# Patient Record
Sex: Female | Born: 1995 | Marital: Single | Smoking: Never smoker
Health system: Southern US, Community
[De-identification: ages and names within clinical notes are randomized; demographics above are authoritative.]

## PROBLEM LIST (undated history)

## (undated) DIAGNOSIS — K219 Gastro-esophageal reflux disease without esophagitis: Secondary | ICD-10-CM

## (undated) DIAGNOSIS — J189 Pneumonia, unspecified organism: Secondary | ICD-10-CM

## (undated) DIAGNOSIS — J45909 Unspecified asthma, uncomplicated: Secondary | ICD-10-CM

## (undated) HISTORY — DX: Unspecified asthma, uncomplicated: J45.909

## (undated) HISTORY — DX: Pneumonia, unspecified organism: J18.9

## (undated) HISTORY — DX: Gastro-esophageal reflux disease without esophagitis: K21.9

## (undated) HISTORY — PX: MYRINGOTOMY W/ TUBES: SHX2060

---
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
## 2015-02-10 ENCOUNTER — Other Ambulatory Visit: Payer: Self-pay | Admitting: Sports Medicine

## 2015-02-10 DIAGNOSIS — S161XXD Strain of muscle, fascia and tendon at neck level, subsequent encounter: Secondary | ICD-10-CM

## 2015-02-23 ENCOUNTER — Ambulatory Visit: Payer: BLUE CROSS/BLUE SHIELD

## 2015-02-25 ENCOUNTER — Ambulatory Visit: Payer: BLUE CROSS/BLUE SHIELD

## 2015-05-16 ENCOUNTER — Emergency Department: Payer: BLUE CROSS/BLUE SHIELD

## 2015-05-16 ENCOUNTER — Encounter: Payer: Self-pay | Admitting: Emergency Medicine

## 2015-05-16 ENCOUNTER — Emergency Department
Admission: EM | Admit: 2015-05-16 | Discharge: 2015-05-16 | Disposition: A | Discharge status: Home / Self Care | Payer: BLUE CROSS/BLUE SHIELD | Attending: Emergency Medicine | Admitting: Emergency Medicine

## 2015-05-16 DIAGNOSIS — J45909 Unspecified asthma, uncomplicated: Secondary | ICD-10-CM | POA: Diagnosis not present

## 2015-05-16 DIAGNOSIS — R079 Chest pain, unspecified: Secondary | ICD-10-CM | POA: Diagnosis present

## 2015-05-16 DIAGNOSIS — J159 Unspecified bacterial pneumonia: Secondary | ICD-10-CM | POA: Diagnosis not present

## 2015-05-16 DIAGNOSIS — J189 Pneumonia, unspecified organism: Secondary | ICD-10-CM

## 2015-05-16 HISTORY — DX: Unspecified asthma, uncomplicated: J45.909

## 2015-05-16 LAB — CBC
HCT: 35.5 % (ref 35.0–47.0)
HEMOGLOBIN: 11.7 g/dL — AB (ref 12.0–16.0)
MCH: 27.7 pg (ref 26.0–34.0)
MCHC: 32.8 g/dL (ref 32.0–36.0)
MCV: 84.4 fL (ref 80.0–100.0)
Platelets: 224 10*3/uL (ref 150–440)
RBC: 4.21 MIL/uL (ref 3.80–5.20)
RDW: 13 % (ref 11.5–14.5)
WBC: 16.8 10*3/uL — ABNORMAL HIGH (ref 3.6–11.0)

## 2015-05-16 LAB — BASIC METABOLIC PANEL
ANION GAP: 9 (ref 5–15)
BUN: 10 mg/dL (ref 6–20)
CO2: 26 mmol/L (ref 22–32)
Calcium: 9.1 mg/dL (ref 8.9–10.3)
Chloride: 102 mmol/L (ref 101–111)
Creatinine, Ser: 0.64 mg/dL (ref 0.44–1.00)
GFR calc Af Amer: >60 mL/min (ref >60)
GLUCOSE: 128 mg/dL — AB (ref 65–99)
POTASSIUM: 3.4 mmol/L — AB (ref 3.5–5.1)
Sodium: 137 mmol/L (ref 135–145)

## 2015-05-16 LAB — TROPONIN I: Troponin I: <0.03 ng/mL (ref <0.031)

## 2015-05-16 MED ORDER — LEVOFLOXACIN 750 MG PO TABS
750.0000 mg | ORAL_TABLET | Freq: Once | ORAL | Status: AC
  Administered 2015-05-16: 750 mg via ORAL
  Filled 2015-05-16: qty 1

## 2015-05-16 MED ORDER — KETOROLAC TROMETHAMINE 30 MG/ML IJ SOLN
30.0000 mg | Freq: Once | INTRAMUSCULAR | Status: AC
  Administered 2015-05-16: 30 mg via INTRAVENOUS
  Filled 2015-05-16: qty 1

## 2015-05-16 MED ORDER — SODIUM CHLORIDE 0.9 % IV SOLN
Freq: Once | INTRAVENOUS | Status: AC
  Administered 2015-05-16: 21:00:00 via INTRAVENOUS

## 2015-05-16 MED ORDER — LEVOFLOXACIN 750 MG PO TABS: 750.0000 mg | ORAL_TABLET | Freq: Every day | ORAL | Status: DC

## 2015-05-16 MED ORDER — ACETAMINOPHEN 500 MG PO TABS
1000.0000 mg | ORAL_TABLET | Freq: Once | ORAL | Status: AC
  Administered 2015-05-16: 1000 mg via ORAL
  Filled 2015-05-16: qty 2

## 2015-05-16 MED ORDER — FENTANYL CITRATE (PF) 100 MCG/2ML IJ SOLN
50.0000 ug | Freq: Once | INTRAMUSCULAR | Status: AC
  Administered 2015-05-16: 50 ug via INTRAVENOUS
  Filled 2015-05-16: qty 2

## 2015-05-16 NOTE — ED Notes (Signed)
MD at bedside. 

## 2015-05-16 NOTE — ED Notes (Addendum)
Pt via EMS for SOB, left sided cp radiating to back, and fever. Pt comes from [place]. Pt febrile and sats 88-93%, RN put pt on 2L at this time. Pt was sick on break at home with cold and states it has worsen. Pt roommate is also sick with cold. Pt A&O

## 2015-05-16 NOTE — ED Notes (Signed)
Pt. was ambulated to monitor O2 stat. O2 stat remained above 90% for entire durration of ambulation

## 2015-05-16 NOTE — ED Provider Notes (Signed)
[facility] Emergency Department Provider Note  ____________________________________________  Time seen: Approximately 9:16 PM  I have reviewed the triage vital signs and the nursing notes.   HISTORY  Chief Complaint Chest Pain and Fever    HPI Kari Walker is a 20 y.o. female with a history of asthma presenting with left-sided chest pain. Patient reports that over the last 2 weeks she developed a cough productive of yellow thick sputum. She has felt worse over the last week with exertional shortness of breath and subjective fever. No nausea or vomiting. Today she awoke from her nap with the onset of an acute sharp pain in the left lower lateral chest wall which radiated to her back, and has improved without any intervention at this time. She denies any lightheadedness or syncope. She does take OCPs. No personal or family history of blood clots.   Past Medical History  Diagnosis Date  . Asthma     There are no active problems to display for this patient.   No past surgical history on file.  No current outpatient prescriptions on file.  Allergies Review of patient's allergies indicates no known allergies.  No family history on file.  Social History Social History  Substance Use Topics  . Smoking status: Never Smoker   . Smokeless tobacco: None  . Alcohol Use: Yes     Comment: occasional     Review of Systems Constitutional: Positive subjective fever. No chills. Eyes: No visual changes. ENT: No sore throat. Positive congestion. Cardiovascular: Positive left lateral chest pain, no palpitations. Respiratory: As of exertional shortness of breath.  As a productive cough. Gastrointestinal: No abdominal pain.  No nausea, no vomiting.  No diarrhea.  No constipation. Genitourinary: Negative for dysuria. Musculoskeletal: Negative for back pain. Skin: Negative for rash. Neurological: Negative for headaches, focal weakness or numbness.  10-point  ROS otherwise negative.  ____________________________________________   PHYSICAL EXAM:  VITAL SIGNS: ED Triage Vitals  Enc Vitals Group     BP 05/16/15 2047 116/53 mmHg     Pulse Rate 05/16/15 2047 112     Resp 05/16/15 2047 20     Temp 05/16/15 2047 103.2 F (39.6 C)     Temp Source 05/16/15 2047 Oral     SpO2 05/16/15 2047 97 %     Weight 05/16/15 2047 150 lb (68.04 kg)     Height 05/16/15 2047 5\' 9"  (1.753 m)     Head Cir --      Peak Flow --      Pain Score 05/16/15 2042 7     Pain Loc --      Pain Edu? --      Excl. in GC? --     Constitutional: Alert and oriented. Well appearing and in no acute distress. Answer question appropriately. Eyes: Conjunctivae are normal.  EOMI. no discharge from the eyes. Head: Atraumatic. Nose: Positive congestion without active rhinorrhea.. Mouth/Throat: Mucous membranes are moist.  Neck: No stridor.  Supple.  No JVD. He gives midline. Cardiovascular: Fast rate, regular rhythm. No murmurs, rubs or gallops.  Respiratory: Clear lungs to auscultation bilaterally. Deep breaths do induce worsening pain. No accessory muscle use or retractions but the patient has a very minimal tachypnea. She is able to speak in full sentences. I am unable to reproduce her left lateral chest wall pain with palpation; no crepitus. Gastrointestinal: Soft and nontender. No distention. No peritoneal signs. Musculoskeletal: No LE edema. No palpable cords, tenderness to Palpation in the calves,  or Homans sign. Neurologic:  Normal speech and language. No gross focal neurologic deficits are appreciated.  Skin:  Skin is warm, dry and intact. No rash noted. Psychiatric: Mood and affect are normal. Speech and behavior are normal.  Normal judgement.  ____________________________________________   LABS (all labs ordered are listed, but only abnormal results are displayed)  Labs Reviewed  BASIC METABOLIC PANEL - Abnormal; Notable for the following:    Potassium 3.4 (*)     Glucose, Bld 128 (*)    All other components within normal limits  CBC - Abnormal; Notable for the following:    WBC 16.8 (*)    Hemoglobin 11.7 (*)    All other components within normal limits  TROPONIN I   ____________________________________________  EKG ED ECG REPORT I, Rockne Menghini, the attending physician, personally viewed and interpreted this ECG.   Date: 05/16/2015  EKG Time: 2044  Rate: 107  Rhythm: sinus tachycardia  Axis: Normal  Intervals:none  ST&T Change: Nonspecific T-wave inversions in V1. No ST elevation. No ischemic changes.  ____________________________________________  RADIOLOGY  Dg Chest 2 View  05/16/2015  CLINICAL DATA:  Complains of cough and left lower rib pain. EXAM: CHEST  2 VIEW COMPARISON:  None. FINDINGS: Cardiomediastinal silhouette is normal. Mediastinal contours appear intact. There is no evidence of pleural effusion or pneumothorax. Subtle haziness of the left lower lobe seen on the frontal view may be artifactual or may represent a developing focal airspace consolidation. Osseous structures are without acute abnormality. Soft tissues are grossly normal. IMPRESSION: Subtle haziness of the left lower lobe, which may be artifactual or may represent a developing airspace consolidation. Electronically Signed   By: Ted Mcalpine M.D.   On: 05/16/2015 21:55    ____________________________________________   PROCEDURES  Procedure(s) performed: None  Critical Care performed: No ____________________________________________   INITIAL IMPRESSION / ASSESSMENT AND PLAN / ED COURSE  Pertinent labs & imaging results that were available during my care of the patient were reviewed by me and considered in my medical decision making (see chart for details).  20 y.o. female with a history of asthma presenting with 2 weeks of productive cough and today acute onset of left lateral chest wall pain. Here, the patient has a fever of 103.2 and  associated tachycardia without tachypnea. I removed her from exogenous oxygen and her sats remained 95% and greater; it is possible that she had a poor tracing with the triage numbers for that she has improved at this time. I will evaluate her for pneumonia consider spontaneous pneumothorax that she is tall white female. PE is less likely although on the differential if the rest of her evaluation remains negative.  ----------------------------------------- 10:01 PM on 05/16/2015 -----------------------------------------  The patient's chest x-ray does show some consolidation in the left lower lobe which is consistent with the pain that she has been having and the clinical symptoms as well. I will do an ambulatory pulse ox to confirm that she does not desaturate when she ambulates, and if she remains within normal limits I will plan discharge home. She will follow-up with her primary care physician or with the [facility] on Monday for reevaluation.  ____________________________________________  FINAL CLINICAL IMPRESSION(S) / ED DIAGNOSES  Final diagnoses:  Community acquired pneumonia      NEW MEDICATIONS STARTED DURING THIS VISIT:  New Prescriptions   No medications on file     Rockne Menghini, MD 05/16/15 2202

## 2015-05-16 NOTE — Discharge Instructions (Signed)
Please drink plenty of fluid and stay well rested. You may use here albuterol inhaler if you develop coughing spasms or wheezing. Please take the entire course of antibiotics even if you're feeling better.  Please return to the emergency department if you develop severe pain, shortness of breath, fever, inability to keep down fluids, or any other symptoms concerning to you.

## 2015-05-28 ENCOUNTER — Emergency Department: Payer: BLUE CROSS/BLUE SHIELD

## 2015-05-28 ENCOUNTER — Inpatient Hospital Stay
Admission: EM | Admit: 2015-05-28 | Discharge: 2015-05-31 | DRG: 871 | Disposition: A | Discharge status: Home / Self Care | Payer: BLUE CROSS/BLUE SHIELD | Attending: Internal Medicine | Admitting: Internal Medicine

## 2015-05-28 DIAGNOSIS — A419 Sepsis, unspecified organism: Secondary | ICD-10-CM | POA: Diagnosis present

## 2015-05-28 DIAGNOSIS — J90 Pleural effusion, not elsewhere classified: Secondary | ICD-10-CM | POA: Diagnosis present

## 2015-05-28 DIAGNOSIS — J45909 Unspecified asthma, uncomplicated: Secondary | ICD-10-CM | POA: Diagnosis present

## 2015-05-28 DIAGNOSIS — J189 Pneumonia, unspecified organism: Secondary | ICD-10-CM | POA: Diagnosis present

## 2015-05-28 DIAGNOSIS — R0602 Shortness of breath: Secondary | ICD-10-CM

## 2015-05-28 LAB — BASIC METABOLIC PANEL
Anion gap: 9 (ref 5–15)
BUN: 10 mg/dL (ref 6–20)
CALCIUM: 9.5 mg/dL (ref 8.9–10.3)
CO2: 27 mmol/L (ref 22–32)
CREATININE: 0.77 mg/dL (ref 0.44–1.00)
Chloride: 96 mmol/L — ABNORMAL LOW (ref 101–111)
GFR calc non Af Amer: >60 mL/min (ref >60)
Glucose, Bld: 137 mg/dL — ABNORMAL HIGH (ref 65–99)
Potassium: 3.8 mmol/L (ref 3.5–5.1)
SODIUM: 132 mmol/L — AB (ref 135–145)

## 2015-05-28 LAB — CBC
HCT: 40.9 % (ref 35.0–47.0)
Hemoglobin: 13.1 g/dL (ref 12.0–16.0)
MCH: 27.5 pg (ref 26.0–34.0)
MCHC: 32.2 g/dL (ref 32.0–36.0)
MCV: 85.4 fL (ref 80.0–100.0)
PLATELETS: 321 10*3/uL (ref 150–440)
RBC: 4.79 MIL/uL (ref 3.80–5.20)
RDW: 12.7 % (ref 11.5–14.5)
WBC: 26.9 10*3/uL — AB (ref 3.6–11.0)

## 2015-05-28 LAB — LACTIC ACID, PLASMA: Lactic Acid, Venous: 1.4 mmol/L (ref 0.5–2.0)

## 2015-05-28 LAB — RAPID INFLUENZA A&B ANTIGENS (ARMC ONLY): INFLUENZA B (ARMC): NEGATIVE

## 2015-05-28 LAB — MONONUCLEOSIS SCREEN: MONO SCREEN: NEGATIVE

## 2015-05-28 LAB — POCT RAPID STREP A: Streptococcus, Group A Screen (Direct): NEGATIVE

## 2015-05-28 LAB — RAPID INFLUENZA A&B ANTIGENS: Influenza A (ARMC): NEGATIVE

## 2015-05-28 MED ORDER — DEXTROSE 5 % IV SOLN
1.0000 g | INTRAVENOUS | Status: AC
  Administered 2015-05-29 – 2015-05-31 (×3): 1 g via INTRAVENOUS
  Filled 2015-05-28 (×3): qty 10

## 2015-05-28 MED ORDER — ENOXAPARIN SODIUM 40 MG/0.4ML ~~LOC~~ SOLN
40.0000 mg | SUBCUTANEOUS | Status: DC
  Administered 2015-05-28 – 2015-05-29 (×2): 40 mg via SUBCUTANEOUS
  Filled 2015-05-28 (×2): qty 0.4

## 2015-05-28 MED ORDER — ACETAMINOPHEN 325 MG PO TABS
650.0000 mg | ORAL_TABLET | Freq: Four times a day (QID) | ORAL | Status: DC | PRN
  Administered 2015-05-28: 19:00:00 650 mg via ORAL
  Filled 2015-05-28: qty 2

## 2015-05-28 MED ORDER — POLYETHYLENE GLYCOL 3350 17 G PO PACK: 17.0000 g | PACK | Freq: Every day | ORAL | Status: DC | PRN

## 2015-05-28 MED ORDER — AZITHROMYCIN 250 MG PO TABS
500.0000 mg | ORAL_TABLET | Freq: Every day | ORAL | Status: DC
  Administered 2015-05-29: 09:00:00 500 mg via ORAL
  Filled 2015-05-28: qty 2

## 2015-05-28 MED ORDER — SODIUM CHLORIDE 0.9 % IV BOLUS (SEPSIS)
1000.0000 mL | Freq: Once | INTRAVENOUS | Status: AC
  Administered 2015-05-28: 1000 mL via INTRAVENOUS

## 2015-05-28 MED ORDER — CEFTRIAXONE SODIUM 1 G IJ SOLR
1.0000 g | INTRAMUSCULAR | Status: DC
  Filled 2015-05-28: qty 10

## 2015-05-28 MED ORDER — ONDANSETRON HCL 4 MG/2ML IJ SOLN: 4.0000 mg | Freq: Four times a day (QID) | INTRAMUSCULAR | Status: DC | PRN

## 2015-05-28 MED ORDER — ONDANSETRON HCL 4 MG PO TABS: 4.0000 mg | ORAL_TABLET | Freq: Four times a day (QID) | ORAL | Status: DC | PRN

## 2015-05-28 MED ORDER — SODIUM CHLORIDE 0.9 % IJ SOLN
3.0000 mL | Freq: Two times a day (BID) | INTRAMUSCULAR | Status: DC
  Administered 2015-05-28 – 2015-05-31 (×6): 3 mL via INTRAVENOUS

## 2015-05-28 MED ORDER — ALBUTEROL SULFATE (2.5 MG/3ML) 0.083% IN NEBU: 2.5000 mg | INHALATION_SOLUTION | RESPIRATORY_TRACT | Status: DC | PRN

## 2015-05-28 MED ORDER — SODIUM CHLORIDE 0.9 % IV BOLUS (SEPSIS)
1500.0000 mL | Freq: Once | INTRAVENOUS | Status: AC
Start: 2015-05-28 — End: 2015-05-28
  Administered 2015-05-28: 1000 mL via INTRAVENOUS

## 2015-05-28 MED ORDER — DEXTROSE 5 % IV SOLN
1.0000 g | Freq: Once | INTRAVENOUS | Status: AC
  Administered 2015-05-28: 1 g via INTRAVENOUS
  Filled 2015-05-28: qty 10

## 2015-05-28 MED ORDER — ACETAMINOPHEN 325 MG PO TABS
650.0000 mg | ORAL_TABLET | Freq: Once | ORAL | Status: AC | PRN
  Administered 2015-05-28: 650 mg via ORAL
  Filled 2015-05-28: qty 2

## 2015-05-28 MED ORDER — DEXTROSE 5 % IV SOLN
500.0000 mg | Freq: Once | INTRAVENOUS | Status: AC
  Administered 2015-05-28: 500 mg via INTRAVENOUS
  Filled 2015-05-28: qty 500

## 2015-05-28 MED ORDER — ACETAMINOPHEN 650 MG RE SUPP: 650.0000 mg | Freq: Four times a day (QID) | RECTAL | Status: DC | PRN

## 2015-05-28 MED ORDER — BISACODYL 5 MG PO TBEC: 5.0000 mg | DELAYED_RELEASE_TABLET | Freq: Every day | ORAL | Status: DC | PRN

## 2015-05-28 NOTE — ED Provider Notes (Signed)
[facility] Emergency Department Provider Note  ____________________________________________  Time seen: Approximately 1:41 PM  I have reviewed the triage vital signs and the nursing notes.   HISTORY  Chief Complaint Fever and URI    HPI Kari Walker is a 20 y.o. female who presents emergency department complaining of increased pneumonia symptoms. Patient was seen here in the emergency department 2 weeks ago, diagnosed with left lower lobe pneumonia and discharged home with antibiotics. Patient states that she had some initial improvement on the oral Levaquin but states that over the past several days her symptoms have returned and worsened. Patient took antibiotics as she was supposed to. She states that she had a sore throat 2-3 days ago and was [facility] And had a negative strep test. She states yesterday she started with a fever, body aches, and increasing "lung pain." She endorses a productive cough, low-grade fever, left lower lung pain. He denies any headache, neck pain, shortness of breath, difficulty breathing, abdominal pain, nausea or vomiting, diarrhea or constipation.   Past Medical History  Diagnosis Date  . Asthma     Patient Active Problem List   Diagnosis Date Noted  . Pneumonia 05/28/2015    History reviewed. No pertinent past surgical history.  Current Outpatient Rx  Name  Route  Sig  Dispense  Refill  . mometasone-formoterol (DULERA) 100-5 MCG/ACT AERO   Inhalation   Inhale 2 puffs into the lungs 2 (two) times daily.         Marland Kitchen albuterol (PROVENTIL HFA;VENTOLIN HFA) 108 (90 Base) MCG/ACT inhaler   Inhalation   Inhale 2 puffs into the lungs every 6 (six) hours as needed for wheezing or shortness of breath.   1 Inhaler   2   . doxycycline (VIBRA-TABS) 100 MG tablet   Oral   Take 1 tablet (100 mg total) by mouth every 12 (twelve) hours.   14 tablet   0   . guaiFENesin (MUCINEX) 600 MG 12 hr tablet    Oral   Take 1 tablet (600 mg total) by mouth 2 (two) times daily as needed for cough.   15 tablet   0     Allergies Review of patient's allergies indicates no known allergies.  History reviewed. No pertinent family history.  Social History Social History  Substance Use Topics  . Smoking status: Never Smoker   . Smokeless tobacco: None  . Alcohol Use: Yes     Comment: occasional      Review of Systems  Constitutional: Positive fever/chills Eyes: No visual changes. No discharge ENT: Positive sore throat 2-3 days prior. Cardiovascular: no chest pain. Respiratory: Positive for cough. No SOB. Gastrointestinal: No abdominal pain.  No nausea, no vomiting.  No diarrhea.  No constipation. Genitourinary: Negative for dysuria. No hematuria Musculoskeletal: Negative for back pain. Skin: Negative for rash. Neurological: Negative for headaches, focal weakness or numbness. 10-point ROS otherwise negative.  ____________________________________________   PHYSICAL EXAM:  VITAL SIGNS: ED Triage Vitals  Enc Vitals Group     BP 05/28/15 1118 105/64 mmHg     Pulse Rate 05/28/15 1118 117     Resp 05/28/15 1118 18     Temp 05/28/15 1118 100.6 F (38.1 C)     Temp Source 05/28/15 1118 Oral     SpO2 05/28/15 1118 94 %     Weight 05/28/15 1118 150 lb (68.04 kg)     Height 05/28/15 1118  (1.753 m)  Head Cir --      Peak Flow --      Pain Score 05/28/15 1118 4     Pain Loc --      Pain Edu? --      Excl. in GC? --      Constitutional: Alert and oriented. Well appearing and in no acute distress. Eyes: Conjunctivae are normal. PERRL. EOMI. Head: Atraumatic. ENT:      Ears:       Nose: No congestion/rhinnorhea.      Mouth/Throat: Mucous membranes are moist.  Neck: No stridor.   Hematological/Lymphatic/Immunilogical: No cervical lymphadenopathy. Cardiovascular: Normal rate, regular rhythm. Normal S1 and S2.  Good peripheral circulation. Respiratory: Normal respiratory  effort without tachypnea or retractions. Lungs with rales and rhonchi to the left lower lung field. No decreased or absent breath sounds. No wheezing. No use of the sensory muscles on breathing. Gastrointestinal: Soft and nontender. No distention. No CVA tenderness. Musculoskeletal: No lower extremity tenderness nor edema.  No joint effusions. Neurologic:  Normal speech and language. No gross focal neurologic deficits are appreciated.  Skin:  Skin is warm, dry and intact. No rash noted. Psychiatric: Mood and affect are normal. Speech and behavior are normal. Patient exhibits appropriate insight and judgement.   ____________________________________________   LABS (all labs ordered are listed, but only abnormal results are displayed)  Labs Reviewed  BASIC METABOLIC PANEL - Abnormal; Notable for the following:    Sodium 132 (*)    Chloride 96 (*)    Glucose, Bld 137 (*)    All other components within normal limits  CBC - Abnormal; Notable for the following:    WBC 26.9 (*)    All other components within normal limits  BASIC METABOLIC PANEL - Abnormal; Notable for the following:    Sodium 133 (*)    Glucose, Bld 103 (*)    Calcium 8.5 (*)    All other components within normal limits  CBC - Abnormal; Notable for the following:    WBC 20.5 (*)    Hemoglobin 11.0 (*)    HCT 33.8 (*)    All other components within normal limits  CBC - Abnormal; Notable for the following:    Hemoglobin 11.3 (*)    HCT 34.2 (*)    All other components within normal limits  BASIC METABOLIC PANEL - Abnormal; Notable for the following:    Glucose, Bld 102 (*)    All other components within normal limits  RAPID INFLUENZA A&B ANTIGENS ([facility] ONLY)  CULTURE, BLOOD (ROUTINE X 2)  CULTURE, BLOOD (ROUTINE X 2)  CULTURE, EXPECTORATED SPUTUM-ASSESSMENT  CULTURE, RESPIRATORY (NON-EXPECTORATED)  MONONUCLEOSIS SCREEN  LACTIC ACID, PLASMA  STREP PNEUMONIAE URINARY ANTIGEN  PROCALCITONIN  PROCALCITONIN  MISC  [facility] TEST (SEND OUT)  POCT RAPID STREP A   ____________________________________________  EKG   ____________________________________________  RADIOLOGY Festus Barren Sumner Kirchman, personally viewed and evaluated these images (plain radiographs) as part of my medical decision making, as well as reviewing the written report by the radiologist.  No results found.  ____________________________________________    PROCEDURES  Procedure(s) performed:       Medications  acetaminophen (TYLENOL) tablet 650 mg (650 mg Oral Given 05/28/15 1125)  cefTRIAXone (ROCEPHIN) 1 g in dextrose 5 % 50 mL IVPB (0 g Intravenous Stopped 05/28/15 1548)  azithromycin (ZITHROMAX) 500 mg in dextrose 5 % 250 mL IVPB (0 mg Intravenous Stopped 05/28/15 1712)  sodium chloride 0.9 % bolus 1,000 mL (0 mLs Intravenous Stopped 05/28/15  1712)  sodium chloride 0.9 % bolus 1,500 mL (1,000 mLs Intravenous Given 05/28/15 1730)  cefTRIAXone (ROCEPHIN) 1 g in dextrose 5 % 50 mL IVPB (1 g Intravenous Given 05/31/15 0753)  azithromycin (ZITHROMAX) 500 mg in dextrose 5 % 250 mL IVPB (500 mg Intravenous Given 05/31/15 0827)  iohexol (OMNIPAQUE) 300 MG/ML solution 75 mL (75 mLs Intravenous Contrast Given 05/29/15 1415)     ____________________________________________   INITIAL IMPRESSION / ASSESSMENT AND PLAN / ED COURSE  Pertinent labs & imaging results that were available during my care of the patient were reviewed by me and considered in my medical decision making (see chart for details).  Patient's diagnosis is consistent with worsening left lower lobe pneumonia. Patient was discharged home from the emergency department 2 weeks ago with a diagnosis of left lower lobe pneumonia and was given oral Levaquin at home use. Patient states that she had initial improvement and has had worsening over the past several days. Repeat chest x-ray revealed increasing infiltrate in the left lower lobe. Patient's labs returned with  increasing white blood cell count. Patient was given IV Rocephin and azithromycin here in the emergency department. This time diagnosis is consistent with worsening left lower lobe pneumonia. There is no indication that this is cardiac in nature, PE or pneumothorax in origin, or other acute pathology. Dr. Alphonzo Lemmings was informed of patient's previous ED discharge diagnosis, clinical progress, assessment today, laboratory and x-ray results. It was decided that this patient would be a good candidate for admission. Hospitalist informed and advised that they would admit patient.    ____________________________________________  FINAL CLINICAL IMPRESSION(S) / ED DIAGNOSES  Final diagnoses:  Community acquired pneumonia      NEW MEDICATIONS STARTED DURING THIS VISIT:  Discharge Medication List as of 05/31/2015 10:41 AM    START taking these medications   Details  albuterol (PROVENTIL HFA;VENTOLIN HFA) 108 (90 Base) MCG/ACT inhaler Inhale 2 puffs into the lungs every 6 (six) hours as needed for wheezing or shortness of breath., Starting 05/31/2015, Until Discontinued, Normal    doxycycline (VIBRA-TABS) 100 MG tablet Take 1 tablet (100 mg total) by mouth every 12 (twelve) hours., Starting 05/31/2015, Until Discontinued, Normal    guaiFENesin (MUCINEX) 600 MG 12 hr tablet Take 1 tablet (600 mg total) by mouth 2 (two) times daily as needed for cough., Starting 05/31/2015, Until Discontinued, Normal            Racheal Patches, PA-C 06/02/15 1514  Jeanmarie Plant, MD 06/04/15 1352

## 2015-05-28 NOTE — H&P (Addendum)
[facility] - [place] [facility]   PATIENT NAME: Kari Walker    MR#:  [id number]  DATE OF BIRTH:  Sep 19, 1995  DATE OF ADMISSION:  05/28/2015  PRIMARY CARE PHYSICIAN: Pcp Not In System   REQUESTING/REFERRING PHYSICIAN: Dr. Silverio Lay  CHIEF COMPLAINT:   Chief Complaint  Patient presents with  . Fever  . URI    HISTORY OF PRESENT ILLNESS:  Kari Walker  is a 20 y.o. female with a known history of asthma presents to the emergency room with persistent fever, cough and myalgias. Patient was seen on 05/16/2015 with right left-sided chest pain, fever, sore throat and loss of appetite. Chest x-ray showed left lower lobe early pneumonia and was discharged home on oral Levaquin. Patient's symptoms improved a little but then she started worsening appetite, cough and shortness of breath. Her left sided pleuritic chest pain has resolved. She noticed fevers of 100.6  along with chills and return to the emergency room [facility]. Today her WBC is 26.9 K, tachycardic, 100.7, showing worsening left lower lobe infiltrate. A mono and strep screen negative in the emergency room.  PAST MEDICAL HISTORY:   Past Medical History  Diagnosis Date  . Asthma     PAST SURGICAL HISTORY:  History reviewed. No pertinent past surgical history. Sinus surgery  SOCIAL HISTORY:   Social History  Substance Use Topics  . Smoking status: Never Smoker   . Smokeless tobacco: Not on file  . Alcohol Use: Yes     Comment: occasional     FAMILY HISTORY:  No family history on file.  Asthma - Mother  DRUG ALLERGIES:  No Known Allergies  REVIEW OF SYSTEMS:   Review of Systems  Constitutional: Positive for fever, chills and malaise/fatigue. Negative for weight loss.  HENT: Negative for hearing loss and nosebleeds.   Eyes: Negative for blurred vision, double vision and pain.  Respiratory: Positive for cough, sputum production and shortness of breath.  Negative for hemoptysis and wheezing.   Cardiovascular: Positive for chest pain. Negative for palpitations, orthopnea and leg swelling.  Gastrointestinal: Negative for nausea, vomiting, abdominal pain, diarrhea and constipation.  Genitourinary: Negative for dysuria and hematuria.  Musculoskeletal: Positive for myalgias. Negative for back pain and falls.  Skin: Negative for rash.  Neurological: Positive for weakness. Negative for dizziness, tremors, sensory change, speech change, focal weakness, seizures and headaches.  Endo/Heme/Allergies: Does not bruise/bleed easily.  Psychiatric/Behavioral: Negative for depression and memory loss. The patient is not nervous/anxious.     MEDICATIONS AT HOME:   Prior to Admission medications   Medication Sig Start Date End Date Taking? Authorizing Provider  levofloxacin (LEVAQUIN) 750 MG tablet Take 1 tablet (750 mg total) by mouth daily. 05/16/15   Rockne Menghini, MD      VITAL SIGNS:  Blood pressure 121/65, pulse 112, temperature 100.7 F (38.2 C), temperature source Oral, resp. rate 18, height  (1.753 m), weight 68.04 kg (150 lb), last menstrual period 05/10/2015, SpO2 99 %.  PHYSICAL EXAMINATION:  Physical Exam  GENERAL:  20 y.o.-year-old patient lying in the bed with no acute distress.  EYES: Pupils equal, round, reactive to light and accommodation. No scleral icterus. Extraocular muscles intact.  HEENT: Head atraumatic, normocephalic. Oropharynx and nasopharynx clear. No oropharyngeal erythema, moist oral mucosa  NECK:  Supple, no jugular venous distention. No thyroid enlargement, no tenderness.  LUNGS: Normal breath sounds bilaterally, no wheezing, rales, rhonchi. No use of accessory muscles of respiration. Decreased  breath sounds left lower lobe CARDIOVASCULAR: S1, S2 normal. No murmurs, rubs, or gallops.  ABDOMEN: Soft, nontender, nondistended. Bowel sounds present. No organomegaly or mass.  EXTREMITIES: No pedal edema, cyanosis,  or clubbing. + 2 pedal & radial pulses b/l.   NEUROLOGIC: Cranial nerves II through XII are intact. No focal Motor or sensory deficits appreciated b/l PSYCHIATRIC: The patient is alert and oriented x 3. Good affect.  SKIN: No obvious rash, lesion, or ulcer.   LABORATORY PANEL:   CBC  Recent Labs Lab 05/28/15 1125  WBC 26.9*  HGB 13.1  HCT 40.9  PLT 321   ------------------------------------------------------------------------------------------------------------------  Chemistries   Recent Labs Lab 05/28/15 1125  NA 132*  K 3.8  CL 96*  CO2 27  GLUCOSE 137*  BUN 10  CREATININE 0.77  CALCIUM 9.5   ------------------------------------------------------------------------------------------------------------------  Cardiac Enzymes No results for input(s): TROPONINI in the last 168 hours. ------------------------------------------------------------------------------------------------------------------  RADIOLOGY:  Dg Chest 2 View  05/28/2015  CLINICAL DATA:  Cough and congestion, diagnosed with pneumonia 2 Saturdays ago, history bronchitis and asthma EXAM: CHEST  2 VIEW COMPARISON:  05/16/2015 FINDINGS: Normal heart size, mediastinal contours, and pulmonary vascularity. Increased LEFT lower lobe opacification consistent with pneumonia. Remaining lungs clear. No pleural effusion or pneumothorax. Bones unremarkable. IMPRESSION: LEFT lower lobe pneumonia with increased infiltrate since 05/16/2015. Electronically Signed   By: Ulyses Southward M.D.   On: 05/28/2015 11:54     IMPRESSION AND PLAN:   * Left lower lobe pneumonia Failed outpatient therapy with Levaquin. Start IV ceftriaxone and azithromycin. Send for sputum cultures and urine streptococcus and legionella antigen. Blood cultures. We will repeat a chest x-ray in 48 hours. There is no pleural effusion on the chest x-ray. If there is no improvement or any worsening on the x-ray she will need a CT scan of the chest.  *  Sepsis due to pneumonia She has received 1 L normal saline bolus. We will add 2 more liters per sepsis protocol. Check lactic acid.  * Asthma No wheezing at this time. We will add nebulizer when necessary.   All the records are reviewed and case discussed with ED provider. Management plans discussed with the patient, family and they are in agreement.   TOTAL TIME TAKING CARE OF THIS PATIENT: 40 minutes.    Milagros Loll R M.D on 05/28/2015 at 3:20 PM  Between 7am to 6pm - Pager - [phone]  After 6pm go to [email or web address] - password [email or web address]  [facility]  Office  [phone]  CC: Primary care physician; Pcp Not In System   Note: This dictation was prepared with Dragon dictation along with smaller phrase technology. Any transcriptional errors that result from this process are unintentional.

## 2015-05-28 NOTE — ED Provider Notes (Signed)
-----------------------------------------   3:24 PM on 05/28/2015 ----------------------------------------- I personally evaluated this patient. Patient with outpatient treatment for pneumonia which failed. Persistent cough persistent fever, cough is productive. Chest x-ray shows infiltrate. Patient has now failed outpatient treatment. She is nontoxic in appearance right now she saturation is reassuring however she will be admitted for IV antibiotics. Lungs show occasional bibasilar rhonchi and cough but no increased work of breathing. Nothing to suggest PE or dissection ACS or other acute pathology of a variety  Jeanmarie Plant, MD 05/28/15 1525

## 2015-05-28 NOTE — ED Notes (Signed)
Pt states she was dx with pneumonia 2 weeks ago and finished abx last Friday, states she began having sore throat and bodyaches yesterday.Marland Kitchen

## 2015-05-28 NOTE — Progress Notes (Signed)
Spoke with Dr Anne Hahn.  Bolus of 1500 cc completed and new BP is 94/50.  Will take BP in 4 hours and continue to watch.  Encouraged pt to drink fluids for which she is compliant. Henriette Combs RN

## 2015-05-28 NOTE — Progress Notes (Signed)
A/o.  Non prod cough/ tylenol for  Temp.  Bolus ivfs.  abx on board

## 2015-05-29 ENCOUNTER — Inpatient Hospital Stay: Payer: BLUE CROSS/BLUE SHIELD

## 2015-05-29 ENCOUNTER — Encounter: Payer: Self-pay | Admitting: Radiology

## 2015-05-29 DIAGNOSIS — J189 Pneumonia, unspecified organism: Secondary | ICD-10-CM

## 2015-05-29 LAB — EXPECTORATED SPUTUM ASSESSMENT W GRAM STAIN, RFLX TO RESP C: Special Requests: NORMAL

## 2015-05-29 LAB — CBC
HEMATOCRIT: 33.8 % — AB (ref 35.0–47.0)
HEMOGLOBIN: 11 g/dL — AB (ref 12.0–16.0)
MCH: 27.5 pg (ref 26.0–34.0)
MCHC: 32.7 g/dL (ref 32.0–36.0)
MCV: 84.2 fL (ref 80.0–100.0)
Platelets: 261 10*3/uL (ref 150–440)
RBC: 4.02 MIL/uL (ref 3.80–5.20)
RDW: 13.1 % (ref 11.5–14.5)
WBC: 20.5 10*3/uL — AB (ref 3.6–11.0)

## 2015-05-29 LAB — BASIC METABOLIC PANEL
ANION GAP: 8 (ref 5–15)
BUN: 7 mg/dL (ref 6–20)
CALCIUM: 8.5 mg/dL — AB (ref 8.9–10.3)
CO2: 23 mmol/L (ref 22–32)
Chloride: 102 mmol/L (ref 101–111)
Creatinine, Ser: 0.61 mg/dL (ref 0.44–1.00)
Glucose, Bld: 103 mg/dL — ABNORMAL HIGH (ref 65–99)
POTASSIUM: 3.6 mmol/L (ref 3.5–5.1)
SODIUM: 133 mmol/L — AB (ref 135–145)

## 2015-05-29 LAB — PROCALCITONIN: PROCALCITONIN: 0.11 ng/mL

## 2015-05-29 LAB — STREP PNEUMONIAE URINARY ANTIGEN: STREP PNEUMO URINARY ANTIGEN: NEGATIVE

## 2015-05-29 LAB — EXPECTORATED SPUTUM ASSESSMENT W REFEX TO RESP CULTURE

## 2015-05-29 MED ORDER — DEXTROSE 5 % IV SOLN
500.0000 mg | INTRAVENOUS | Status: AC
  Administered 2015-05-30 – 2015-05-31 (×2): 500 mg via INTRAVENOUS
  Filled 2015-05-29 (×2): qty 500

## 2015-05-29 MED ORDER — IOHEXOL 300 MG/ML  SOLN
75.0000 mL | Freq: Once | INTRAMUSCULAR | Status: AC | PRN
  Administered 2015-05-29: 75 mL via INTRAVENOUS

## 2015-05-29 MED ORDER — GUAIFENESIN ER 600 MG PO TB12
600.0000 mg | ORAL_TABLET | Freq: Two times a day (BID) | ORAL | Status: DC
  Administered 2015-05-29 – 2015-05-31 (×5): 600 mg via ORAL
  Filled 2015-05-29 (×5): qty 1

## 2015-05-29 MED ORDER — MOMETASONE FURO-FORMOTEROL FUM 100-5 MCG/ACT IN AERO
2.0000 | INHALATION_SPRAY | Freq: Two times a day (BID) | RESPIRATORY_TRACT | Status: DC
  Administered 2015-05-29 – 2015-05-31 (×5): 2 via RESPIRATORY_TRACT
  Filled 2015-05-29: qty 8.8

## 2015-05-29 MED ORDER — SODIUM CHLORIDE 0.9 % IV SOLN
INTRAVENOUS | Status: DC
  Administered 2015-05-29: 16:00:00 via INTRAVENOUS

## 2015-05-29 NOTE — Progress Notes (Signed)
Pts parents updated regarding pts status, states that they have also spoke with MD

## 2015-05-29 NOTE — Progress Notes (Signed)
Spoke with patients family they want pulm to evaluate patient

## 2015-05-29 NOTE — Progress Notes (Signed)
0000 UA sent to lab and per Gabriel Rung it is a send out.  No pending information on Results review at this time. Henriette Combs RN

## 2015-05-29 NOTE — Consult Note (Signed)
PULMONARY CONSULT NOTE  Requesting MD/Service: Allena Katz Date of consultation: 05/29/15  Reason for consultation: PNA  PT PROFILE: Previously healthy 24 F with LLL consolidation on CXR and CT chest c/w CAP  HPI:  36 F who usually enjoys excellent health with only chronic medical problem problem of asthma initially presented to an urgent care center approx 2 weeks ago with L pleurodynia and subsequently seen in [facility] ED with same and fever where CXR on 01/07 revealed vague LLL opacity treated as outpatient with levofloxacin. She initially improved but after completion of antibiotics she relapsed with fever, myalgias, SOB and pleuritic CP. She was admitted 05/28/15 after CXR and CT chest revealed LLL consolidation. Admission dx of PNA. She feels better on the day of this evaluation and in fact desires to go home. Denies hemoptysis, LE edema and calf tenderness. No HIV RFs  Past Medical History  Diagnosis Date  . Asthma     History reviewed. No pertinent past surgical history.  MEDICATIONS: I have reviewed all medications and confirmed regimen as documented  Social History   Social History  . Marital Status: Single    Spouse Name: N/A  . Number of Children: N/A  . Years of Education: N/A   Occupational History  . Not on file.   Social History Main Topics  . Smoking status: Never Smoker   . Smokeless tobacco: Not on file  . Alcohol Use: Yes     Comment: occasional   . Drug Use: No  . Sexual Activity: Not on file   Other Topics Concern  . Not on file   Social History Narrative    History reviewed. No pertinent family history.  ROS: No unexplained weight loss or weight gain No new focal weakness or sensory deficits No otalgia, hearing loss, visual changes, nasal and sinus symptoms, mouth and throat problems No neck pain or adenopathy No abdominal pain, N/V/D, diarrhea, change in bowel pattern No dysuria, change in urinary pattern No LE edema or calf tenderness   Filed  Vitals:   05/28/15 2049 05/29/15 0202 05/29/15 0536 05/29/15 1218  BP: 94/50 102/54 102/48 96/47  Pulse: 92 91 91 87  Temp: 98.3 F (36.8 C) 98.4 F (36.9 C) 99.8 F (37.7 C) 98.7 F (37.1 C)  TempSrc: Oral Oral Oral Oral  Resp: Height:      Weight:      SpO2: 97% 98% 96% 99%   EXAM:  Gen: WDWN, No overt respiratory distress HEENT: NCAT, oropharynx normal Neck: Supple without LAN, thyromegaly, JVD Lungs: breath sounds markedly diminished in LLL, dull to percussion in LLL, minimal rhonchi, no wheezes Cardiovascular: Reg rhythm, rate normal, no murmurs noted Abdomen: Soft, nontender, normal BS Ext: without clubbing, cyanosis, edema Neuro: CNs grossly intact, motor and sensory intact, DTRs symmetric Skin: Limited exam, no lesions noted  DATA:   BMP Latest Ref Rng 05/29/2015 05/28/2015 05/16/2015  Glucose 65 - 99 mg/dL 161(W) 960(A) 540(J)  BUN 6 - 20 mg/dL Creatinine 0.44 - 1.00 mg/dL 8.11 9.14 7.82  Sodium 135 - 145 mmol/L 133(L) 132(L) 137  Potassium 3.5 - 5.1 mmol/L 3.6 3.8 3.4(L)  Chloride 101 - 111 mmol/L 102 96(L) 102  CO2 22 - 32 mmol/L Calcium 8.9 - 10.3 mg/dL 9.5(A) 9.5 9.1    CBC Latest Ref Rng 05/29/2015 05/28/2015 05/16/2015  WBC 3.6 - 11.0 K/uL 20.5(H) 26.9(H) 16.8(H)  Hemoglobin 12.0 - 16.0 g/dL 11.0(L) 13.1 11.7(L)  Hematocrit 35.0 - 47.0 % 33.8(L) 40.9 35.5  Platelets 150 - 440 K/uL 261 321 224   CXR 05/28/15: LLL opacification CT chest 05/28/15: dense LLL consolidation with very small parapneumonic effusion CXR 05/29/15:  LLL opacification slightly increased  IMPRESSION:   CAP with very small parapneumonic effusion- I don't think there is any question re: the diagnosis here. It is almost certainly bacterial in origin given Xray appearance. She feels better than on admission. The most concerning aspect here is the apparent "failure" of levofloxacin. Nonetheless, I don't think we need to entertain the possibility of opportunistic  organisms or a complicated process such as empyema. Since she apparently "failed" levofloxacin, I encourage that she get 2-3 days of IV abx prior to discharge  PLAN:  Sputum culture sent Check urine strep and legionella antigens Procalcitonin algorithm ordered Continue ceftriaxone and azithromycin through WE Would then transition to PO doxycycline to complete 10 days total of abx (unless legionella antigen positive, then azithromycin or levofloxacin for total of 14 days) She will need follow up as outpt in pulmonary office with repeat CXR in 2-3 weeks after DC   Merwyn Katos, MD [facility] [place] [facility], Critical Care Medicine

## 2015-05-29 NOTE — Plan of Care (Signed)
Problem: Education: Goal: Knowledge of [facility] Education information/materials will improve Outcome: Progressing Pt oriented to unit and knows how to contact RN and NA for assistance.  Problem: Respiratory: Goal: Respiratory status will improve Outcome: Progressing Weak cough with SAO2 98% on RA. Goal: Pain level will decrease Outcome: Progressing No complaints of pain

## 2015-05-29 NOTE — Progress Notes (Signed)
[facility] [place] [facility]                                                                                                                                                                                            Patient Demographics   Kari Walker, is a 20 y.o. female, DOB - [birth year], ZOX:[id number]  Admit date - 05/28/2015   Admitting Physician Milagros Loll, MD  Outpatient Primary MD for the patient is Pcp Not In System   LOS - 1  Subjective: Patient continues to complain of cough and congestion. Chest x-ray shows slight worsening. WBC is trending down.     Review of Systems:   CONSTITUTIONAL: No documented fever. No fatigue, weakness. No weight gain, no weight loss.  EYES: No blurry or double vision.  ENT: No tinnitus. No postnasal drip. No redness of the oropharynx.  RESPIRATORY: Positive cough, no wheeze, no hemoptysis. No dyspnea.  CARDIOVASCULAR: No chest pain. No orthopnea. No palpitations. No syncope.  GASTROINTESTINAL: No nausea, no vomiting or diarrhea. No abdominal pain. No melena or hematochezia.  GENITOURINARY: No dysuria or hematuria.  ENDOCRINE: No polyuria or nocturia. No heat or cold intolerance.  HEMATOLOGY: No anemia. No bruising. No bleeding.  INTEGUMENTARY: No rashes. No lesions.  MUSCULOSKELETAL: No arthritis. No swelling. No gout.  NEUROLOGIC: No numbness, tingling, or ataxia. No seizure-type activity.  PSYCHIATRIC: No anxiety. No insomnia. No ADD.    Vitals:   Filed Vitals:   05/28/15 2049 05/29/15 0202 05/29/15 0536 05/29/15 1218  BP: 94/50 102/54 102/48 96/47  Pulse: 92 91 91 87  Temp: 98.3 F (36.8 C) 98.4 F (36.9 C) 99.8 F (37.7 C) 98.7 F (37.1 C)  TempSrc: Oral Oral Oral Oral  Resp: Height:      Weight:      SpO2: 97% 98% 96% 99%    Wt Readings from Last 3 Encounters:  05/28/15 68.04 kg (150 lb) (80 %*, Z = 0.84)  05/16/15 68.04 kg (150 lb) (80 %*, Z = 0.84)   * Growth  percentiles are based on CDC 2-20 Years data.     Intake/Output Summary (Last 24 hours) at 05/29/15 1316 Last data filed at 05/29/15 0800  Gross per 24 hour  Intake    740 ml  Output    900 ml  Net   -160 ml    Physical Exam:   GENERAL: Pleasant-appearing in no apparent distress.  HEAD, EYES, EARS, NOSE AND THROAT: Atraumatic, normocephalic. Extraocular muscles are intact. Pupils equal and reactive to light. Sclerae anicteric. No conjunctival injection. No oro-pharyngeal erythema.  NECK:  Supple. There is no jugular venous distention. No bruits, no lymphadenopathy, no thyromegaly.  HEART: Regular rate and rhythm,. No murmurs, no rubs, no clicks.  LUNGS: Bilateral occasional wheezing no rhonchi or rales  ABDOMEN: Soft, flat, nontender, nondistended. Has good bowel sounds. No hepatosplenomegaly appreciated.  EXTREMITIES: No evidence of any cyanosis, clubbing, or peripheral edema.  +2 pedal and radial pulses bilaterally.  NEUROLOGIC: The patient is alert, awake, and oriented x3 with no focal motor or sensory deficits appreciated bilaterally.  SKIN: Moist and warm with no rashes appreciated.  Psych: Not anxious, depressed LN: No inguinal LN enlargement    Antibiotics   Anti-infectives    Start     Dose/Rate Route Frequency Ordered Stop   05/29/15 1000  azithromycin (ZITHROMAX) tablet 500 mg     500 mg Oral Daily 05/28/15 1500     05/29/15 1000  cefTRIAXone (ROCEPHIN) 1 g in dextrose 5 % 50 mL IVPB     1 g 100 mL/hr over 30 Minutes Intravenous Every 24 hours 05/28/15 1741     05/29/15 0000  cefTRIAXone (ROCEPHIN) 1 g in dextrose 5 % 50 mL IVPB  Status:  Discontinued     1 g 100 mL/hr over 30 Minutes Intravenous Every 24 hours 05/28/15 1500 05/28/15 1741   05/28/15 1345  cefTRIAXone (ROCEPHIN) 1 g in dextrose 5 % 50 mL IVPB     1 g 100 mL/hr over 30 Minutes Intravenous  Once 05/28/15 1343 05/28/15 1548   05/28/15 1345  azithromycin (ZITHROMAX) 500 mg in dextrose 5 % 250 mL IVPB      500 mg 250 mL/hr over 60 Minutes Intravenous  Once 05/28/15 1343 05/28/15 1712      Medications   Scheduled Meds: . azithromycin  500 mg Oral Daily  . cefTRIAXone (ROCEPHIN)  IV  1 g Intravenous Q24H  . enoxaparin (LOVENOX) injection  40 mg Subcutaneous Q24H  . guaiFENesin  600 mg Oral BID  . sodium chloride  3 mL Intravenous Q12H   Continuous Infusions:  PRN Meds:.acetaminophen **OR** acetaminophen, albuterol, bisacodyl, ondansetron **OR** ondansetron (ZOFRAN) IV, polyethylene glycol   Data Review:   Micro Results Recent Results (from the past 240 hour(s))  Rapid Influenza A&B Antigens ([facility] only)     Status: None   Collection Time: 05/28/15  2:25 PM  Result Value Ref Range Status   Influenza A ([facility]) NEGATIVE  Final   Influenza B ([facility]) NEGATIVE  Final  Culture, blood (routine x 2)     Status: None (Preliminary result)   Collection Time: 05/28/15  2:25 PM  Result Value Ref Range Status   Specimen Description BLOOD RIGHT AC  Final   Special Requests BOTTLES DRAWN AEROBIC AND ANAEROBIC  Final   Culture NO GROWTH < 24 HOURS  Final   Report Status PENDING  Incomplete  Culture, blood (routine x 2)     Status: None (Preliminary result)   Collection Time: 05/28/15  2:25 PM  Result Value Ref Range Status   Specimen Description BLOOD RIGHT HAND  Final   Special Requests BOTTLES DRAWN AEROBIC AND ANAEROBIC  Final   Culture NO GROWTH < 24 HOURS  Final   Report Status PENDING  Incomplete    Radiology Reports Dg Chest 2 View  05/29/2015  CLINICAL DATA:  Shortness of breath and productive cough. Pneumonia. EXAM: CHEST  2 VIEW COMPARISON:  Chest radiograph from one day prior. FINDINGS: Stable cardiomediastinal silhouette with normal heart size. No pneumothorax. Small left pleural effusion,  slightly increased. No right pleural effusion. Patchy consolidation in the left lower lobe, slightly increased. No pulmonary edema. IMPRESSION: 1. Slightly increased patchy left lower  lobe consolidation, most in keeping with a left lower lobe pneumonia. 2. Slightly increased small left pleural effusion. Electronically Signed   By: Delbert Phenix M.D.   On: 05/29/2015 07:32   Dg Chest 2 View  05/28/2015  CLINICAL DATA:  Cough and congestion, diagnosed with pneumonia 2 Saturdays ago, history bronchitis and asthma EXAM: CHEST  2 VIEW COMPARISON:  05/16/2015 FINDINGS: Normal heart size, mediastinal contours, and pulmonary vascularity. Increased LEFT lower lobe opacification consistent with pneumonia. Remaining lungs clear. No pleural effusion or pneumothorax. Bones unremarkable. IMPRESSION: LEFT lower lobe pneumonia with increased infiltrate since 05/16/2015. Electronically Signed   By: Ulyses Southward M.D.   On: 05/28/2015 11:54   Dg Chest 2 View  05/16/2015  CLINICAL DATA:  Complains of cough and left lower rib pain. EXAM: CHEST  2 VIEW COMPARISON:  None. FINDINGS: Cardiomediastinal silhouette is normal. Mediastinal contours appear intact. There is no evidence of pleural effusion or pneumothorax. Subtle haziness of the left lower lobe seen on the frontal view may be artifactual or may represent a developing focal airspace consolidation. Osseous structures are without acute abnormality. Soft tissues are grossly normal. IMPRESSION: Subtle haziness of the left lower lobe, which may be artifactual or may represent a developing airspace consolidation. Electronically Signed   By: Ted Mcalpine M.D.   On: 05/16/2015 21:55     CBC  Recent Labs Lab 05/28/15 1125 05/29/15 0700  WBC 26.9* 20.5*  HGB 13.1 11.0*  HCT 40.9 33.8*  PLT 321 261  MCV 85.4 84.2  MCH 27.5 27.5  MCHC 32.2 32.7  RDW 12.7 13.1    Chemistries   Recent Labs Lab 05/28/15 1125 05/29/15 0700  NA 132* 133*  K 3.8 3.6  CL 96* 102  CO2 27 23  GLUCOSE 137* 103*  BUN 10 7  CREATININE 0.77 0.61  CALCIUM 9.5 8.5*    ------------------------------------------------------------------------------------------------------------------ estimated creatinine clearance is 118.2 mL/min (by C-G formula based on Cr of 0.61). ------------------------------------------------------------------------------------------------------------------ No results for input(s): HGBA1C in the last 72 hours. ------------------------------------------------------------------------------------------------------------------ No results for input(s): CHOL, HDL, LDLCALC, TRIG, CHOLHDL, LDLDIRECT in the last 72 hours. ------------------------------------------------------------------------------------------------------------------ No results for input(s): TSH, T4TOTAL, T3FREE, THYROIDAB in the last 72 hours.  Invalid input(s): FREET3 ------------------------------------------------------------------------------------------------------------------ No results for input(s): VITAMINB12, FOLATE, FERRITIN, TIBC, IRON, RETICCTPCT in the last 72 hours.  Coagulation profile No results for input(s): INR, PROTIME in the last 168 hours.  No results for input(s): DDIMER in the last 72 hours.  Cardiac Enzymes No results for input(s): CKMB, TROPONINI, MYOGLOBIN in the last 168 hours.  Invalid input(s): CK ------------------------------------------------------------------------------------------------------------------ Invalid input(s): POCBNP    Assessment & Plan  Patient is a 20 year old was failed outpatient antibiotic therapy for pneumonia   *Left lower lobe pneumonia Chest x-ray this morning shows worsening infiltrate at this time I will get a CT scan of the chest to further evaluate. Continue ceftriaxone azithromycin Admission   * Sepsis due to pneumonia Improved. Continue supportive care   * Asthma Occasional wheezing continue nebs when necessary if worsens will add steroids     Code Status Orders        Start     Ordered    05/28/15 1500  Full code   Continuous     05/28/15 1500    Code Status History    Date Active Date Inactive Code Status  Order ID Comments User Context   This patient has a current code status but no historical code status.           Consults  none   DVT Prophylaxis  Lovenox   Lab Results  Component Value Date   PLT 261 05/29/2015     Time Spent in minutes    Auburn Bilberry M.D on 05/29/2015 at 1:16 PM  Between 7am to 6pm - Pager - [phone]  After 6pm go to [email or web address] - password [email or web address] [facility]  [facility]   Office  [phone]

## 2015-05-29 NOTE — Progress Notes (Signed)
Dr Allena Katz made aware that CT result are back and pts parents requesting MD to call them, MD states that he will call

## 2015-05-30 LAB — BASIC METABOLIC PANEL
Anion gap: 5 (ref 5–15)
BUN: 6 mg/dL (ref 6–20)
CHLORIDE: 108 mmol/L (ref 101–111)
CO2: 26 mmol/L (ref 22–32)
Calcium: 8.9 mg/dL (ref 8.9–10.3)
Creatinine, Ser: 0.49 mg/dL (ref 0.44–1.00)
GFR calc Af Amer: >60 mL/min (ref >60)
GFR calc non Af Amer: >60 mL/min (ref >60)
GLUCOSE: 102 mg/dL — AB (ref 65–99)
POTASSIUM: 3.7 mmol/L (ref 3.5–5.1)
SODIUM: 139 mmol/L (ref 135–145)

## 2015-05-30 LAB — CBC
HCT: 34.2 % — ABNORMAL LOW (ref 35.0–47.0)
HEMOGLOBIN: 11.3 g/dL — AB (ref 12.0–16.0)
MCH: 27.7 pg (ref 26.0–34.0)
MCHC: 32.9 g/dL (ref 32.0–36.0)
MCV: 84.1 fL (ref 80.0–100.0)
Platelets: 256 10*3/uL (ref 150–440)
RBC: 4.07 MIL/uL (ref 3.80–5.20)
RDW: 12.8 % (ref 11.5–14.5)
WBC: 9 10*3/uL (ref 3.6–11.0)

## 2015-05-30 LAB — PROCALCITONIN: Procalcitonin: 0.1 ng/mL

## 2015-05-30 MED ORDER — DOXYCYCLINE HYCLATE 100 MG PO TABS
100.0000 mg | ORAL_TABLET | Freq: Two times a day (BID) | ORAL | Status: DC
  Administered 2015-05-31: 100 mg via ORAL
  Filled 2015-05-30: qty 1

## 2015-05-30 NOTE — Progress Notes (Signed)
She feels better with no new complaints  Filed Vitals:   05/29/15 0536 05/29/15 1218 05/29/15 2107 05/30/15 0543  BP: 102/48 96/47 106/57 107/61  Pulse: 91 87 87 73  Temp: 99.8 F (37.7 C) 98.7 F (37.1 C) 98.4 F (36.9 C) 98.3 F (36.8 C)  TempSrc: Oral Oral Oral Oral  Resp: Height:      Weight:      SpO2: 96% 99% 98% 94%   EXAM:  Gen: WDWN, No distress HEENT: WNL Neck: Supple without LAN Lungs: breath sounds markedly diminished in LLL, no wheezes Cardiovascular: Reg rhythm, no murmurs Abdomen: Soft, nontender, normal BS Ext: no edema  BMET    Component Value Date/Time   NA 139 05/30/2015 0538   K 3.7 05/30/2015 0538   CL 108 05/30/2015 0538   CO2 26 05/30/2015 0538   GLUCOSE 102* 05/30/2015 0538   BUN 6 05/30/2015 0538   CREATININE 0.49 05/30/2015 0538   CALCIUM 8.9 05/30/2015 0538   GFRNONAA >60 05/30/2015 0538   GFRAA >60 05/30/2015 0538    CBC Latest Ref Rng 05/30/2015 05/29/2015 05/28/2015  WBC 3.6 - 11.0 K/uL 9.0 20.5(H) 26.9(H)  Hemoglobin 12.0 - 16.0 g/dL 11.3(L) 11.0(L) 13.1  Hematocrit 35.0 - 47.0 % 34.2(L) 33.8(L) 40.9  Platelets 150 - 440 K/uL 256 261 321   Strep Urine Ag negative Legionella Ag pending PCT 0.11 > 0.10 Resp cx pending  IMPRESSION:  CAP with very small parapneumonic effusion  PLAN/REC: I updated pt's parents in detail over phone Would like her to get one more dose of IV abx tomorrow AM Then she may be discharged to home on Doxycycline. Would complete 10 days total from the day of admission I have ordered CXR for AM 01/22 Mobilize/ambulate - I took the liberty of stopping telemetry My office will contact her next week to arrange outpt F/U in Pulm clinic in 2-3 weeks with repeat CXR  Billy Fischer, MD PCCM service Mobile [phone] Pager [phone]

## 2015-05-30 NOTE — Progress Notes (Addendum)
[facility] -  at [facility]   PATIENT NAME: Kari Walker    MR#:  [id number]  DATE OF BIRTH:  [birth year]  SUBJECTIVE:  Some cough. No fever. Eating well  REVIEW OF SYSTEMS:   Review of Systems  Constitutional: Negative for fever, chills and weight loss.  HENT: Negative for ear discharge, ear pain and nosebleeds.   Eyes: Negative for blurred vision, pain and discharge.  Respiratory: Positive for cough. Negative for sputum production, shortness of breath, wheezing and stridor.   Cardiovascular: Negative for chest pain, palpitations, orthopnea and PND.  Gastrointestinal: Negative for nausea, vomiting, abdominal pain and diarrhea.  Genitourinary: Negative for urgency and frequency.  Musculoskeletal: Negative for back pain and joint pain.  Neurological: Negative for sensory change, speech change, focal weakness and weakness.  Psychiatric/Behavioral: Negative for depression and hallucinations. The patient is not nervous/anxious.   All other systems reviewed and are negative.  Tolerating Diet:yes Tolerating PT: not needed  DRUG ALLERGIES:  No Known Allergies  VITALS:  Blood pressure 100/56, pulse 86, temperature 97.9 F (36.6 C), temperature source Oral, resp. rate 20, height  (1.753 m), weight 68.04 kg (150 lb), last menstrual period 05/10/2015, SpO2 99 %.  PHYSICAL EXAMINATION:   Physical Exam  GENERAL:  20 y.o.-year-old patient lying in the bed with no acute distress.  EYES: Pupils equal, round, reactive to light and accommodation. No scleral icterus. Extraocular muscles intact.  HEENT: Head atraumatic, normocephalic. Oropharynx and nasopharynx clear.  NECK:  Supple, no jugular venous distention. No thyroid enlargement, no tenderness.  LUNGS: decreased breath sounds mild Left LL, no wheezing, rales, rhonchi. No use of accessory muscles of respiration.  CARDIOVASCULAR: S1, S2 normal. No murmurs, rubs, or gallops.  ABDOMEN: Soft, nontender,  nondistended. Bowel sounds present. No organomegaly or mass.  EXTREMITIES: No cyanosis, clubbing or edema b/l.    NEUROLOGIC: Cranial nerves II through XII are intact. No focal Motor or sensory deficits b/l.   PSYCHIATRIC: The patient is alert and oriented x 3.  SKIN: No obvious rash, lesion, or ulcer.   LABORATORY PANEL:  CBC  Recent Labs Lab 05/30/15 0538  WBC 9.0  HGB 11.3*  HCT 34.2*  PLT 256    Chemistries   Recent Labs Lab 05/30/15 0538  NA 139  K 3.7  CL 108  CO2 26  GLUCOSE 102*  BUN 6  CREATININE 0.49  CALCIUM 8.9    Cardiac Enzymes No results for input(s): TROPONINI in the last 168 hours. RADIOLOGY:  Dg Chest 2 View  05/29/2015  CLINICAL DATA:  Shortness of breath and productive cough. Pneumonia. EXAM: CHEST  2 VIEW COMPARISON:  Chest radiograph from one day prior. FINDINGS: Stable cardiomediastinal silhouette with normal heart size. No pneumothorax. Small left pleural effusion, slightly increased. No right pleural effusion. Patchy consolidation in the left lower lobe, slightly increased. No pulmonary edema. IMPRESSION: 1. Slightly increased patchy left lower lobe consolidation, most in keeping with a left lower lobe pneumonia. 2. Slightly increased small left pleural effusion. Electronically Signed   By: Delbert Phenix M.D.   On: 05/29/2015 07:32   Ct Chest W Contrast  05/29/2015  CLINICAL DATA:  Cough and fever EXAM: CT CHEST WITH CONTRAST TECHNIQUE: Multidetector CT imaging of the chest was performed during intravenous contrast administration. CONTRAST:  75mL OMNIPAQUE IOHEXOL 300 MG/ML  SOLN COMPARISON:  Chest radiograph May 29, 2015 FINDINGS: Mediastinum/Lymph Nodes: Visualized thyroid is normal. There is no demonstrable thoracic adenopathy. Thymic tissue is normal in this  age group. Aorta appears normal. Visualized great vessels appear normal. No major vessel pulmonary embolus appreciable. Pericardium is not thickened. Lungs/Pleura: There is airspace  consolidation involving essentially the entire left lower lobe. There is mild right base atelectatic change posteriorly. Lungs elsewhere are clear. Upper abdomen: Visualized upper abdominal structures appear unremarkable. Musculoskeletal: There are no blastic or lytic bone lesions. IMPRESSION: Essentially complete left lower lobe consolidation. Mild atelectasis posterior right lung base. Lungs elsewhere clear. No demonstrable adenopathy. Study otherwise unremarkable. Electronically Signed   By: Bretta Bang III M.D.   On: 05/29/2015 14:28   ASSESSMENT AND PLAN:   20 year old was failed outpatient antibiotic therapy for pneumonia   *Left lower lobe pneumonia, community acquired Chest x-ray this morning shows worsening infiltrate  -CT chest shows left LL consolidation - Continue ceftriaxone, azithromycin and doxycycline -wbc 26--20--9.0 -BC neg  * Sepsis due to pneumonia Improved. Continue supportive care -d/c IVF BC neg  * Asthma Occasional wheezing continue nebs when necessary if worsens will add steroids  Case discussed with Care Management/Social Worker. Management plans discussed with the patient and in agreement.  CODE STATUS: Full  DVT Prophylaxis: lovenox  TOTAL TIME TAKING CARE OF THIS PATIENT: 30 minutes.  >50% time spent on counselling and coordination of care  POSSIBLE D/C IN 1DAYS, DEPENDING ON CLINICAL CONDITION.  Note: This dictation was prepared with Dragon dictation along with smaller phrase technology. Any transcriptional errors that result from this process are unintentional.  Jalen Oberry M.D on 05/30/2015 at 3:03 PM  Between 7am to 6pm - Pager - [phone]  After 6pm go to [email or web address] - password [email or web address]  [facility]  Office  [phone]  CC: Primary care physician; Pcp Not In System

## 2015-05-31 ENCOUNTER — Inpatient Hospital Stay: Payer: BLUE CROSS/BLUE SHIELD

## 2015-05-31 LAB — CULTURE, RESPIRATORY W GRAM STAIN

## 2015-05-31 LAB — CULTURE, RESPIRATORY
CULTURE: NORMAL
SPECIAL REQUESTS: NORMAL

## 2015-05-31 MED ORDER — DOXYCYCLINE HYCLATE 100 MG PO TABS: 100.0000 mg | ORAL_TABLET | Freq: Two times a day (BID) | ORAL | Status: AC

## 2015-05-31 MED ORDER — GUAIFENESIN ER 600 MG PO TB12: 600.0000 mg | ORAL_TABLET | Freq: Two times a day (BID) | ORAL | Status: AC | PRN

## 2015-05-31 MED ORDER — ALBUTEROL SULFATE HFA 108 (90 BASE) MCG/ACT IN AERS
2.0000 | INHALATION_SPRAY | Freq: Four times a day (QID) | RESPIRATORY_TRACT | Status: AC | PRN
Start: 2015-05-31 — End: ?

## 2015-05-31 NOTE — Progress Notes (Signed)
Pt discharged to home per orders, pt and mom given d/c instructions r/t activity, and medications voiced understanding, pt d/c home via wheelchair escorted by staff and family

## 2015-05-31 NOTE — Discharge Summary (Signed)
[facility] - [place] at [facility]   PATIENT NAME: Kari Walker    MR#:  [id number]  DATE OF BIRTH:  Dec 12, 1995  DATE OF ADMISSION:  05/28/2015 ADMITTING PHYSICIAN: Milagros Loll, MD  DATE OF DISCHARGE: 05/31/15  PRIMARY CARE PHYSICIAN: Pcp Not In System    ADMISSION DIAGNOSIS:  Pneumonia [J18.9] Community acquired pneumonia [J18.9]  DISCHARGE DIAGNOSIS:   Left LL Community acquired pneumonia [J18.9]  SECONDARY DIAGNOSIS:   Past Medical History  Diagnosis Date  . Asthma     HOSPITAL COURSE:   20 year old was failed outpatient antibiotic therapy for pneumonia   *Left lower lobe pneumonia, community acquired Chest x-ray this morning shows improvement in  Her Llinfiltrate  -CT chest shows left LL consolidation - Continue ceftriaxone, azithromycin and doxycycline -wbc 26--20--9.0 -BC neg -afebrile -change to po doxycycline for 7 more days (spoke with Dr Sung Amabile)  * Sepsis due to pneumonia Improved. Continue supportive care -d/c IVF BC neg  * Asthma Occasional wheezing continue inhalers when necessary  Overall much better. D/w pt and mom Ok to d/c home  CONSULTS OBTAINED:     DRUG ALLERGIES:  No Known Allergies  DISCHARGE MEDICATIONS:   Current Discharge Medication List    START taking these medications   Details  albuterol (PROVENTIL HFA;VENTOLIN HFA) 108 (90 Base) MCG/ACT inhaler Inhale 2 puffs into the lungs every 6 (six) hours as needed for wheezing or shortness of breath. Qty: 1 Inhaler, Refills: 2    doxycycline (VIBRA-TABS) 100 MG tablet Take 1 tablet (100 mg total) by mouth every 12 (twelve) hours. Qty: 14 tablet, Refills: 0    guaiFENesin (MUCINEX) 600 MG 12 hr tablet Take 1 tablet (600 mg total) by mouth 2 (two) times daily as needed for cough. Qty: 15 tablet, Refills: 0      CONTINUE these medications which have NOT CHANGED   Details  mometasone-formoterol (DULERA) 100-5 MCG/ACT AERO Inhale 2 puffs into the  lungs 2 (two) times daily.        If you experience worsening of your admission symptoms, develop shortness of breath, life threatening emergency, suicidal or homicidal thoughts you must seek medical attention immediately by calling 911 or calling your MD immediately  if symptoms less severe.  You Must read complete instructions/literature along with all the possible adverse reactions/side effects for all the Medicines you take and that have been prescribed to you. Take any new Medicines after you have completely understood and accept all the possible adverse reactions/side effects.   Please note  You were cared for by a hospitalist during your hospital stay. If you have any questions about your discharge medications or the care you received while you were in the hospital after you are discharged, you can call the unit and asked to speak with the hospitalist on call if the hospitalist that took care of you is not available. Once you are discharged, your primary care physician will handle any further medical issues. Please note that NO REFILLS for any discharge medications will be authorized once you are discharged, as it is imperative that you return to your primary care physician (or establish a relationship with a primary care physician if you do not have one) for your aftercare needs so that they can reassess your need for medications and monitor your lab values. Today   SUBJECTIVE   Feels a lot better  VITAL SIGNS:  Blood pressure 104/52, pulse 62, temperature 98.1 F (36.7 C), temperature source Oral, resp. rate 18, height   (1.753 m), weight 68.04 kg (150 lb), last menstrual period 05/10/2015, SpO2 97 %.  I/O:   Intake/Output Summary (Last 24 hours) at 05/31/15 1034 Last data filed at 05/30/15 1600  Gross per 24 hour  Intake      0 ml  Output      3 ml  Net     -3 ml    PHYSICAL EXAMINATION:  GENERAL:  20 y.o.-year-old patient lying in the bed with no acute distress.   EYES: Pupils equal, round, reactive to light and accommodation. No scleral icterus. Extraocular muscles intact.  HEENT: Head atraumatic, normocephalic. Oropharynx and nasopharynx clear.  NECK:  Supple, no jugular venous distention. No thyroid enlargement, no tenderness.  LUNGS: Normal breath sounds bilaterally, no wheezing, rales,rhonchi or crepitation. No use of accessory muscles of respiration.  CARDIOVASCULAR: S1, S2 normal. No murmurs, rubs, or gallops.  ABDOMEN: Soft, non-tender, non-distended. Bowel sounds present. No organomegaly or mass.  EXTREMITIES: No pedal edema, cyanosis, or clubbing.  NEUROLOGIC: Cranial nerves II through XII are intact. Muscle strength 5/5 in all extremities. Sensation intact. Gait not checked.  PSYCHIATRIC: The patient is alert and oriented x 3.  SKIN: No obvious rash, lesion, or ulcer.   DATA REVIEW:   CBC   Recent Labs Lab 05/30/15 0538  WBC 9.0  HGB 11.3*  HCT 34.2*  PLT 256    Chemistries   Recent Labs Lab 05/30/15 0538  NA 139  K 3.7  CL 108  CO2 26  GLUCOSE 102*  BUN 6  CREATININE 0.49  CALCIUM 8.9    Microbiology Results   Recent Results (from the past 240 hour(s))  Rapid Influenza A&B Antigens ([facility] only)     Status: None   Collection Time: 05/28/15  2:25 PM  Result Value Ref Range Status   Influenza A ([facility]) NEGATIVE  Final   Influenza B ([facility]) NEGATIVE  Final  Culture, blood (routine x 2)     Status: None (Preliminary result)   Collection Time: 05/28/15  2:25 PM  Result Value Ref Range Status   Specimen Description BLOOD RIGHT AC  Final   Special Requests BOTTLES DRAWN AEROBIC AND ANAEROBIC  Final   Culture NO GROWTH 3 DAYS  Final   Report Status PENDING  Incomplete  Culture, blood (routine x 2)     Status: None (Preliminary result)   Collection Time: 05/28/15  2:25 PM  Result Value Ref Range Status   Specimen Description BLOOD RIGHT HAND  Final   Special Requests BOTTLES DRAWN AEROBIC AND ANAEROBIC  Final    Culture NO GROWTH 3 DAYS  Final   Report Status PENDING  Incomplete  Culture, expectorated sputum-assessment     Status: None   Collection Time: 05/29/15  4:10 PM  Result Value Ref Range Status   Specimen Description EXPECTORATED SPUTUM  Final   Special Requests Normal  Final   Sputum evaluation THIS SPECIMEN IS ACCEPTABLE FOR SPUTUM CULTURE  Final   Report Status 05/29/2015 FINAL  Final  Culture, respiratory (NON-Expectorated)     Status: None   Collection Time: 05/29/15  4:10 PM  Result Value Ref Range Status   Specimen Description EXPECTORATED SPUTUM  Final   Special Requests Normal Reflexed from [id number]  Final   Gram Stain   Final    GOOD SPECIMEN - 80-90% WBCS MODERATE WBC SEEN RARE GRAM POSITIVE RODS RARE YEAST    Culture Consistent with normal respiratory flora.  Final   Report Status 05/31/2015  FINAL  Final    RADIOLOGY:  Ct Chest W Contrast  05/29/2015  CLINICAL DATA:  Cough and fever EXAM: CT CHEST WITH CONTRAST TECHNIQUE: Multidetector CT imaging of the chest was performed during intravenous contrast administration. CONTRAST:  75mL OMNIPAQUE IOHEXOL 300 MG/ML  SOLN COMPARISON:  Chest radiograph May 29, 2015 FINDINGS: Mediastinum/Lymph Nodes: Visualized thyroid is normal. There is no demonstrable thoracic adenopathy. Thymic tissue is normal in this age group. Aorta appears normal. Visualized great vessels appear normal. No major vessel pulmonary embolus appreciable. Pericardium is not thickened. Lungs/Pleura: There is airspace consolidation involving essentially the entire left lower lobe. There is mild right base atelectatic change posteriorly. Lungs elsewhere are clear. Upper abdomen: Visualized upper abdominal structures appear unremarkable. Musculoskeletal: There are no blastic or lytic bone lesions. IMPRESSION: Essentially complete left lower lobe consolidation. Mild atelectasis posterior right lung base. Lungs elsewhere clear. No demonstrable adenopathy. Study  otherwise unremarkable. Electronically Signed   By: Bretta Bang III M.D.   On: 05/29/2015 14:28     Management plans discussed with the patient, family and they are in agreement.  CODE STATUS:     Code Status Orders        Start     Ordered   05/28/15 1500  Full code   Continuous     05/28/15 1500    Code Status History    Date Active Date Inactive Code Status Order ID Comments User Context   This patient has a current code status but no historical code status.      TOTAL TIME TAKING CARE OF THIS PATIENT: 40 minutes.    Rob Mciver M.D on 05/31/2015 at 10:34 AM  Between 7am to 6pm - Pager - [phone] After 6pm go to [email or web address] - password [email or web address]  [facility]  Office  [phone]  CC: Primary care physician; Pcp Not In System

## 2015-06-01 ENCOUNTER — Telehealth: Payer: Self-pay | Admitting: *Deleted

## 2015-06-01 DIAGNOSIS — J189 Pneumonia, unspecified organism: Secondary | ICD-10-CM

## 2015-06-01 NOTE — Telephone Encounter (Signed)
LMOM for pt to call back and schedule f/u with DS with a CXR prior to appt.

## 2015-06-01 NOTE — Telephone Encounter (Signed)
-----   Message from Merwyn Katos, MD sent at 05/29/2015  4:53 PM EST ----- Please arrange office F/U in 2-3 weeks with me and with CXR  Thanks, Theodoro Grist

## 2015-06-02 LAB — CULTURE, BLOOD (ROUTINE X 2)
CULTURE: NO GROWTH
Culture: NO GROWTH

## 2015-06-03 NOTE — Telephone Encounter (Signed)
appt scheduled and CXR order placed. Nothing further needed.

## 2015-06-06 LAB — MISC LABCORP TEST (SEND OUT)
LABCORP TEST CODE: 9985
LABCORP TEST NAME: 8856

## 2015-06-12 ENCOUNTER — Ambulatory Visit: Payer: BLUE CROSS/BLUE SHIELD | Admitting: Pulmonary Disease

## 2015-06-17 ENCOUNTER — Encounter: Payer: Self-pay | Admitting: Internal Medicine

## 2016-11-28 IMAGING — CR DG CHEST 2V
1 series · 2 of 2 positions shown · non-contrast
Comparison: 05/29/2015

CLINICAL DATA: Left side pneumonia

EXAM:
CHEST  2 VIEW

[Series 1: w chest pa · 0.14mm/px · 2 of 2 slices shown]
[im 1/2]
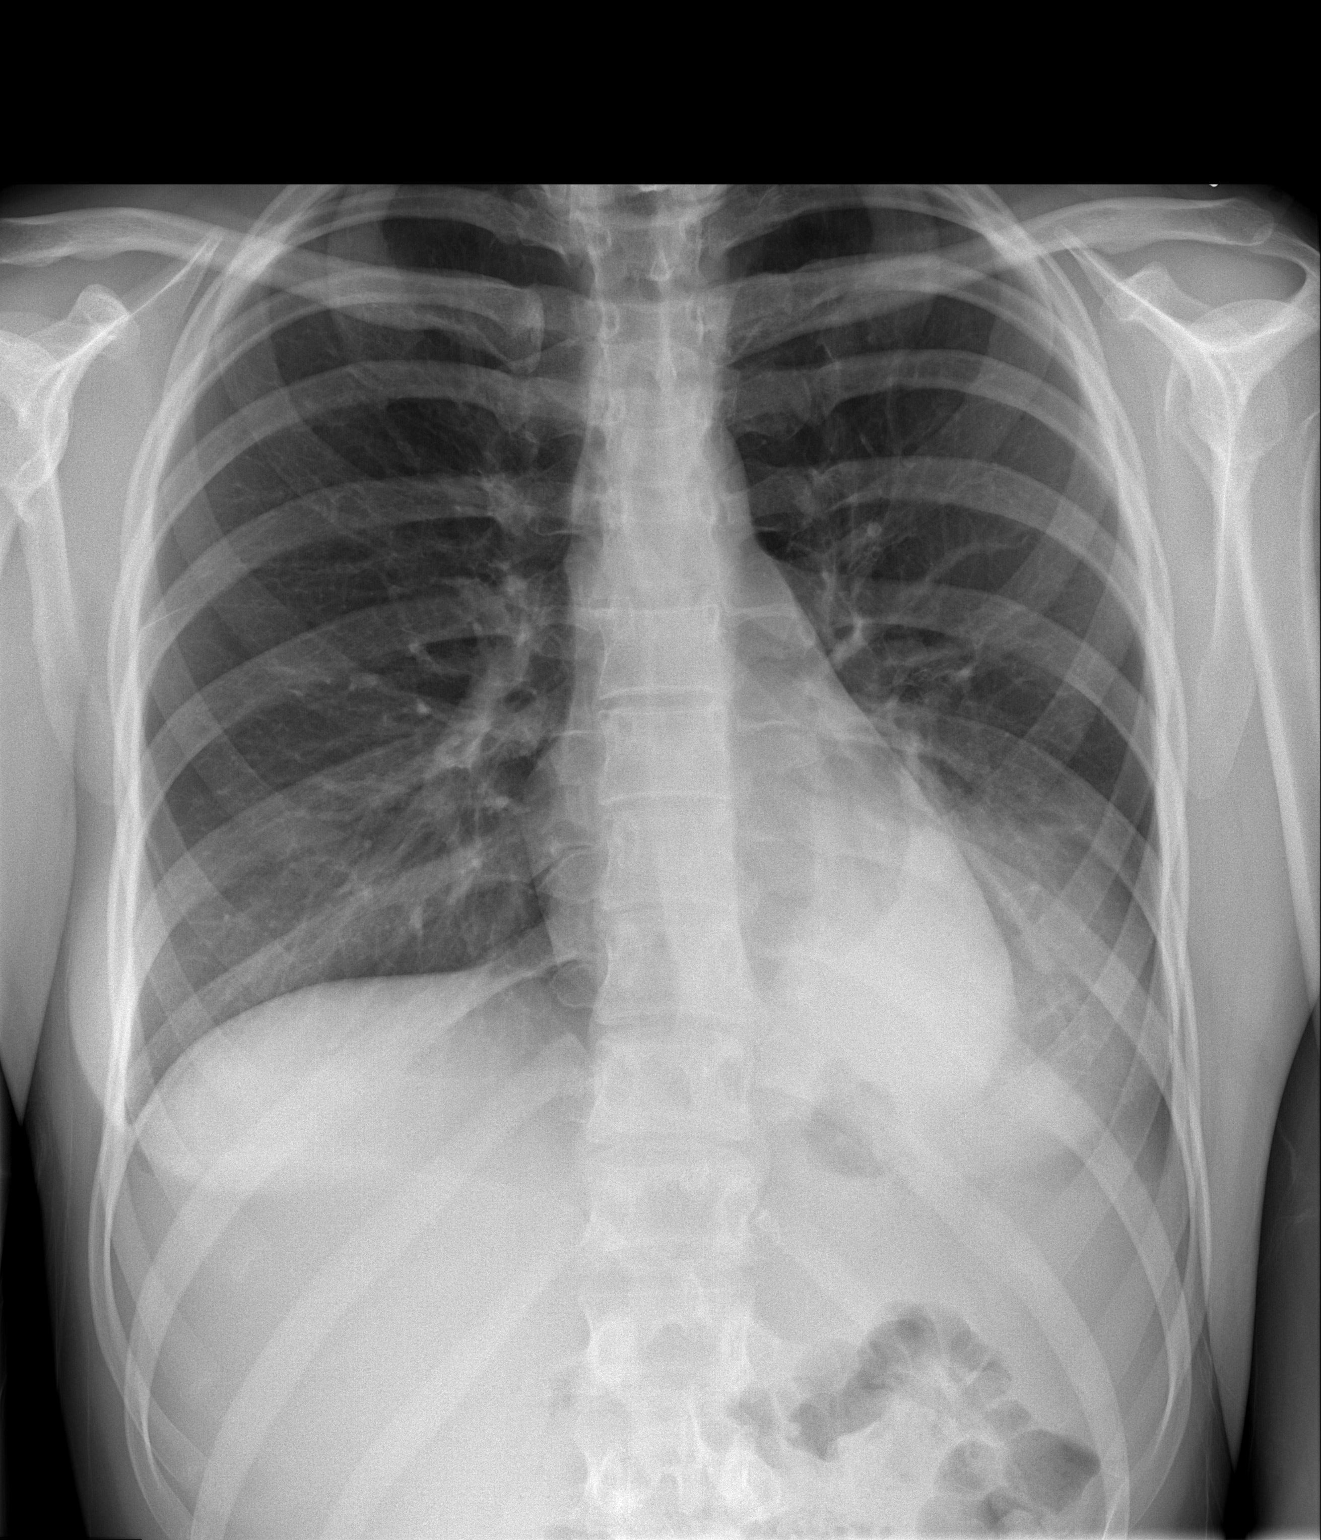
[im 2/2]
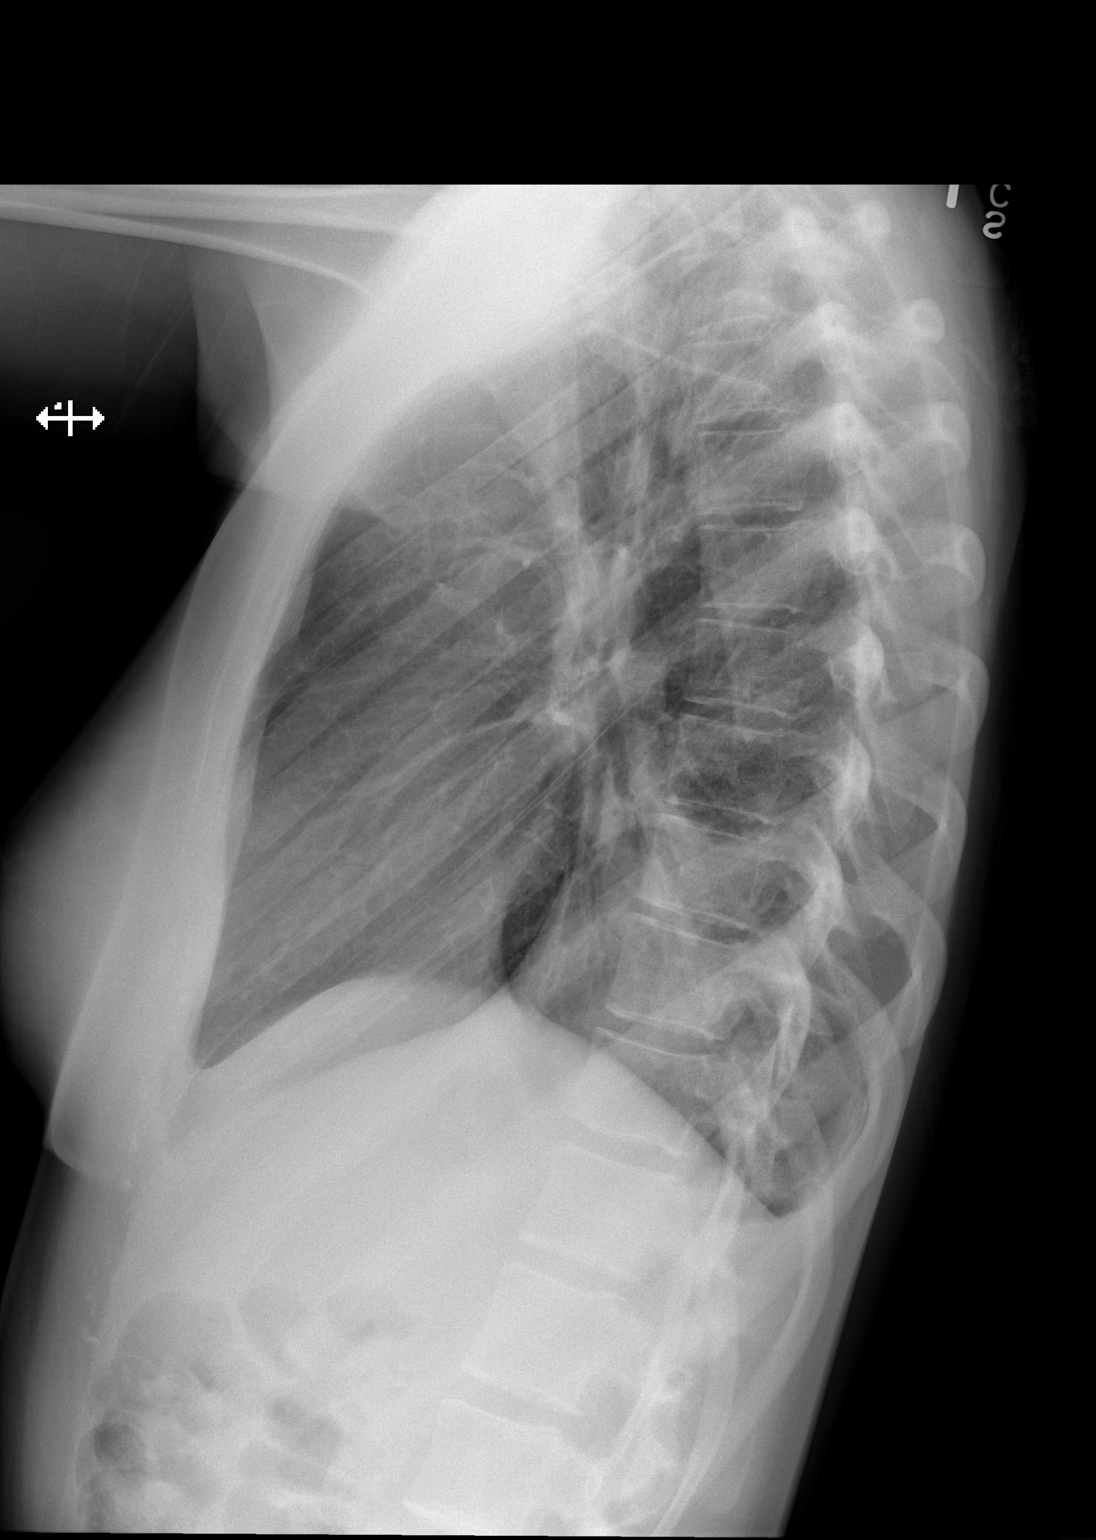

[2 of 2 positions shown; findings below may reference images not displayed]

FINDINGS: Cardiomediastinal silhouette is stable. Persistent infiltrate/
consolidation in left lower lobe. No pulmonary edema. Right lung is
clear. There is no pneumothorax.
IMPRESSION: Persistent infiltrate/ consolidation in left lower lobe. No
pulmonary edema.

## 2016-12-14 ENCOUNTER — Encounter: Payer: Self-pay | Admitting: Family Medicine

## 2016-12-14 ENCOUNTER — Ambulatory Visit: Payer: BC Managed Care – PPO | Admitting: Family Medicine

## 2016-12-14 DIAGNOSIS — J189 Pneumonia, unspecified organism: Principal | ICD-10-CM

## 2016-12-14 MED ORDER — LEENA 0.5/1/0.5-35 MG-MCG OR TABS
ORAL_TABLET | ORAL | Status: AC
Start: 2016-10-27 — End: ?

## 2016-12-14 NOTE — Progress Notes (Signed)
Subjective:   Kathleen Gibbs is a 21 year old female who is here for Asthma; Sinus Problem; and Cough      HPI    Issues discussed today:  Pt here for initial visit, is with her mom for the visit. Pt feels well today, but has long hx of recurrent sinus, bronchial, ear infections most of the year requiring antibiotics. Summer is her only season where she feels the best. Pt gets more severe viral colds that take a long time to resolve. Sx since born with above issues, mom states at 953 mo old mom noted congestion and had to sleep upright in order to breathe. ET tubes by 21 yr old b/c recurrent infections, 3 ET tube placement, last ones placed at 21 yrs old when adenoids removed at same time. After surgery ear infections subsided, but then started having respiratory infections and needed abx, but had respiratory infections before surgery along with ear infections.   Pt has had immunology evaluation, bloodwork didn't show anything and pt doesn't recall ever being told she would need IG injections. Pt had allergy evaluations, tested for many things, positive to mold and dust. Was never told needed allergy shots. Foods were never tested by allergist. Kathleen Gibbs Zyrtec in the fall and spring b/c pollens in [place], goes to school for the past 3 years, feels worse there and had her 3 pneumonia episodes, cxr confirmed, there, required hospitalization for 4 days once, 05/2015, resolved then had 2 new pneumonia 3/17 and 11/17.   Dx asthma when very young, difficulty to use medications then. Since junior HS was more compliant. Was using Dulera, now using QVar 2 puffs bid which seemed to work better than other inhalers used. Doesn't currently own peak flow meter.   Pt has never had autoimmune panel before.   Hx small syrinx between C6-C7 spine found with MRI around 21 yrs old when had eval for neck pain. Pt told monitoring only needed, has regular MRI to evaluate.   Couple UTI in lifetime, abx took care of sx.   Pt notes memory issues,  has trouble remembering both past and recent random things. Noticed this more in college.   Diet consists of eggs, avocado w/toast, cereal, oatmeal, salads, sandwiches, veggies w/hummus, sushi, chipotle, burgers/chicken burger w/fries, acai bowls, lots of ice cream and chocolate. No past food reactions, but at times gets stomach aches but not sure from what foods. Heartburn sometimes, not sure which foods cause it but hot sauce might be a trigger.   Pt is dance major, heavy school schedule. Pt is senior and [employer].   During school year takes Vit B12 gummy, mushroom cap for immune support given by acupuncturist.       Review of Systems   Constitutional: Negative.    HENT: Negative.    Eyes: Negative.    Respiratory: Negative.    Cardiovascular: Negative.    Gastrointestinal: Negative.         Current Outpatient Prescriptions   Medication Sig Dispense Refill   . LEENA 0.5/1/0.5-35 MG-MCG TABS        No current facility-administered medications for this visit.      No Known Allergies    Reviewed patients pertinent information related to social history, past medical, past surgical, and family history.     Objective:  Vital signs: BP 111/62 (BP Location: Left arm, BP Patient Position: Sitting, BP cuff size: Regular)  Pulse 62  Temp 97 F (36.1 C) (Oral)  Ht 5\' 8"  (  1.727 m)  Wt 65.9 kg (145 lb 4.5 oz)  LMP 12/11/2016  SpO2 98%  BMI 22.09 kg/m2    Physical Exam   Constitutional: She is oriented to person, place, and time. She appears well-developed and well-nourished.   HENT:   Head: Normocephalic and atraumatic.   Right Ear: External ear normal.   Left Ear: External ear normal.   Mouth/Throat: Oropharynx is clear and moist.   Bilat turbinate inflamed w/mild erythema.    Eyes: EOM are normal. Pupils are equal, round, and reactive to light. No scleral icterus.   Neck: Normal range of motion. Neck supple. No thyromegaly present.   Cardiovascular: Normal rate and regular rhythm.    Lymphadenopathy:     She  has no cervical adenopathy.   Neurological: She is alert and oriented to person, place, and time.   Skin: Skin is warm and dry.   Psychiatric: She has a normal mood and affect. Her behavior is normal. Judgment and thought content normal.   Nursing note and vitals reviewed.    Peak flows: 440/440/440    Assessment/Plan:  Janalee Grobe was seen today for Asthma; Sinus Problem; and Cough   Pt with above recurrent infections, likely pt having immune dysfunction. According to patient, she has had both formal immunological and allergy evaluation, but has not had food intolerance or autoimmune evaluation done. Pt may be consuming intol foods, toxin load, and/or nutritional insufficiencies, and/or chronic infectious agents. Will put pt on 4R GI restoration program and will order further testing mentioned below. Spent one hour w/pt and mom, answered questions and concerns.     Recommendations:  1. MRT food intolerance testing  2. Bloodwork for autoimmunity and antibodies against chronic infectious agents  3. Begin Opticleanse GHI shake use as directed once/day, Orthobiotic 1 cap/day, and follow anti inflammatory diet guideline as much as possible  4. Vit D 2000 iu/day taken with food.  5. Phone consult w/patient once all labs back.     Diagnosed with:    ICD-10-CM ICD-9-CM    1. Pneumonia due to organism J18.9 486    2. Chronic bronchitis, unspecified chronic bronchitis type (CMS-HCC) J42 491.9    3. Other chronic sinusitis J32.8 473.8      No orders of the defined types were placed in this encounter.

## 2016-12-14 NOTE — Patient Instructions (Signed)
Recommendations:  1. MRT food intolerance testing  2. Bloodwork for autoimmunity and antibodies against chronic infectious agents  3. Begin Opticleanse GHI shake use as directed once/day, Orthobiotic 1 cap/day, and follow anti inflammatory diet guideline as much as possible  4. Vit D 2000 iu/day taken with food.  5. Phone consult w/patient once all labs back.

## 2017-03-29 ENCOUNTER — Encounter: Payer: Self-pay | Admitting: Family Medicine

## 2017-03-29 ENCOUNTER — Ambulatory Visit: Payer: BC Managed Care – PPO | Admitting: Family Medicine

## 2017-03-29 VITALS — BP 115/70 | HR 80 | Temp 97.9°F | Wt 153.7 lb

## 2017-03-29 DIAGNOSIS — J328 Other chronic sinusitis: Principal | ICD-10-CM

## 2017-03-29 MED ORDER — AZELASTINE-FLUTICASONE 137-50 MCG/ACT NA SUSP: NASAL | Status: AC

## 2017-03-29 MED ORDER — BECLOMETHASONE DIPROP HFA 80 MCG/ACT IN AERB
2.00 | INHALATION_SPRAY | RESPIRATORY_TRACT | Status: AC
Start: 2017-02-13 — End: ?

## 2017-03-29 MED ORDER — ALBUTEROL SULFATE 108 (90 BASE) MCG/ACT IN AERS
2.00 | INHALATION_SPRAY | RESPIRATORY_TRACT | Status: AC
Start: 2017-02-13 — End: ?

## 2017-03-29 NOTE — Progress Notes (Signed)
Subjective:   Kathleen Gibbs is a 21 year old female who is here for Follow up Results      HPI    Issues discussed today:  Pt here with her mom to review MRT results. Pt does identify that she eats number of the moderately intolerant foods. Has been taking Opticleanse GHI shake on occasion , is taking Vit D and probiotics qd. Did not make any dietary changes.   Pt just found out that she has been dancing on floor that was discovered has mold underneath. Danced in that space for hours. Mom has hx mold related illness, wants to know if pt might have the same that is contributing to her health.       Review of Systems     Current Outpatient Prescriptions   Medication Sig Dispense Refill    albuterol 108 (90 Base) MCG/ACT inhaler Inhale 2 puffs by mouth.      Azelastine-Fluticasone (DYMISTA) 137-50 MCG/ACT SUSP       beclomethasone (QVAR REDIHALER) 80 MCG/ACT inhaler Inhale 2 puffs by mouth.      LEENA 0.5/1/0.5-35 MG-MCG TABS        No current facility-administered medications for this visit.      No Known Allergies    Reviewed patients pertinent information related to social history, past medical, past surgical, and family history.     Objective:  Vital signs: BP 115/70 (BP Location: Right arm, BP Patient Position: Sitting, BP cuff size: Regular)   Pulse 80   Temp 97.9 F (36.6 C) (Tympanic)   Wt 69.7 kg (153 lb 10.6 oz)   LMP 03/15/2017   SpO2 99%   BMI 23.36 kg/m2    Physical Exam  MRT results showing multiple food reactions to foods in all categories.   Assessment/Plan:  Kathleen Gibbs was seen today for Follow up Results     Pt with above recurrent infections, likely pt having immune dysfunction. According to patient, she has had both formal immunological and allergy evaluation, but has not had food intolerance or autoimmune evaluation done.MRT testing shows multiple food intolerances and likely contributing to her ongoing chronic symptoms. Awaiting results from my lab order during our first visit, have been  in communication with [facility] where she had MRT collection done, and if needed pt given prior lab order to have drawn in near future.    Recommendations:  1. Follow 3 month modified MRT diet as discussed in our visit.  2. Begin taking Mitocor MVI 2 caps daily with breakfast  3. Drink Opticleanse GHI shake more often to allow for GI system healing, discussed ways to make it more palatable.  4. Go to [email or web address] website, take VCS test to further evaluate past/current mold exposure and if pt will need biotoxin lab panel and MARCoNS nasal swab done. Pt to provide copy of VCS results for my review.  5. Pt to follow up to review other lab results as they come in, rec pt to see Tacy Learnachelle Rodriguez NP since my schedule is very full.      Diagnosed with:    ICD-10-CM ICD-9-CM    1. Other chronic sinusitis J32.8 473.8    2. Allergy to food Z91.018 V15.05    3. Chronic bronchitis, unspecified chronic bronchitis type (CMS-HCC) J42 491.9    4. Pneumonia due to organism J18.9 486      No orders of the defined types were placed in this encounter.

## 2017-03-29 NOTE — Patient Instructions (Signed)
Recommendations:  1. Follow 3 month modified MRT diet as discussed in our visit.  2. Begin taking Mitocor MVI 2 caps daily with breakfast  3. Drink Opticleanse GHI shake more often to allow for GI system healing, discussed ways to make it more palatable.  4. Go to [email or web address] website, take VCS test to further evaluate past/current mold exposure and if pt will need biotoxin lab panel and MARCoNS nasal swab done. Pt to provide copy of VCS results for my review.  5. Pt to follow up to review other lab results as they come in, rec pt to see Tacy Learnachelle Rodriguez NP since my schedule is very full.

## 2017-04-04 ENCOUNTER — Telehealth: Payer: Self-pay | Admitting: Family Medicine

## 2017-04-04 NOTE — Telephone Encounter (Signed)
Dr. Roselle Locusominguez pt.    Mother called would like to setup an appointment for nasal MARCoNS swab test. States pt is out of town would like to schedule an appt between Dec 10- Jan 3. Please call to schedule appt. [phone].

## 2017-04-07 NOTE — Telephone Encounter (Signed)
Called pt and lvm to give me a call back to schedule an appt for the nasal swab done per Dr.Palladino

## 2017-04-19 ENCOUNTER — Ambulatory Visit: Payer: BC Managed Care – PPO | Admitting: Registered Nurse

## 2017-04-19 ENCOUNTER — Encounter: Payer: Self-pay | Admitting: Registered Nurse

## 2017-04-19 DIAGNOSIS — J328 Other chronic sinusitis: Principal | ICD-10-CM

## 2017-04-19 NOTE — Progress Notes (Signed)
BRIEF VISIT FOR PROCEDURE    Subjective:    Kathleen Gibbs is a 21 year old female who is home from college during her holiday break. She is here today accompanied by her mother for Microbiology nasal swab for MARCONS. She has been evaluated by Dr. Roselle Locusominguez and is in process of obtaining specialty labs for Mold Toxicity.    [name] states she is doing well, and denies any changes, or new symptoms since last visit with Dr. Roselle Locusominguez on March 29, 2017.      Objective:  The patient is well developed and well nourished, ambulatory, in no acute distress and alert & oriented x 3.      Assessment:   Chronic bronchitis  Pneumonia  Probable Mold exposure    Plan:   Diagnostic testing for nasal culture of MARCoNS      Procedure Note:  Patient was lying supine on exam table. The Nasal Culture swab was inserted first into the left nostril to the back of the nasopharnx and rotated for approx 3 seconds. It was withdrawn, then inserted into the right nostril, and advanced to the back of the nasopharynx, and rotated for approx 3 seconds before withdrawn. No blood, or other adverse effects. Patient tolerated procedure well.    Specimen was labeled, transmittal completed and signed by patient's mom, and package given to them for mailing.     Answered questions regarding phone number to [facility] at [address], [place], [place] for specialty labs that were previously ordered.    Patient will follow up with Dr. Roselle [facility]ocusominguez [facility].

## 2017-04-19 NOTE — Patient Instructions (Signed)
Specimen was labeled, transmittal completed and signed by patient's mom, and package given to them for mailing.     Answered questions and provided phone number [[phone]] to [facility] at [address], [place], [place] for specialty labs that were previously ordered.   Patient will follow up with Dr. Roselle Locusominguez once [facility] results are [facility].

## 2017-04-25 ENCOUNTER — Ambulatory Visit: Payer: BC Managed Care – PPO | Admitting: Registered Nurse

## 2017-05-06 LAB — CHLAMYDIA TRACHOMATIS AB (IGG, IGA, IGM) - QUEST
C. Trachomatis Iga: <1:16 {titer}
C. Trachomatis Igg: <1:64 {titer}
C. Trachomatis Igm: <1:10 {titer}

## 2017-05-06 LAB — CBC WITH DIFF, BLOOD
Abs Basophils: 69 cells/uL (ref 0–200)
Abs Eosinophils: 170 cells/uL (ref 15–500)
Abs Lymphs: 2759 cells/uL (ref 850–3900)
Abs Monocytes: 548 cells/uL (ref 200–950)
Abs NRBC: 0 cells/uL
Abs Neutrophils: 2753 cells/uL (ref 1500–7800)
Basophils: 1.1 %
Eosinophils: 2.7 %
HCT: 40.9 % (ref 35.0–45.0)
HGB: 13.8 g/dL (ref 11.7–15.5)
Lymps: 43.8 %
MCH: 28.9 pg (ref 27.0–33.0)
MCHC: 33.7 g/dL (ref 32.0–36.0)
MCV: 85.7 fL (ref 80.0–100.0)
MPV: 12.1 fL (ref 7.5–12.5)
Monocytes: 8.7 %
PLT: 285 10*3/uL (ref 140–400)
RBC: 4.77 10*6/uL (ref 3.80–5.10)
RDW: 12.1 % (ref 11.0–15.0)
SEGS: 43.7 %
WBC: 6.3 10*3/uL (ref 3.8–10.8)

## 2017-05-06 LAB — HSV 1/2 IGG,TYPE SPECIFIC AB HERPESELECT -QUEST
Hsv 1 Igg Type Specific Ab: <0.9 index
Hsv 2 Igg Type Specific Ab: <0.9 index

## 2017-05-06 LAB — MYCOPLASMA PNEUMONIAE ANTIBODIES (IGG,IGM) - QUEST HISTORICAL
M pneumoniae Ab (IgG): 1.19 — ABNORMAL HIGH
M pneumoniae Ab (IgM): 861 U/mL — ABNORMAL HIGH

## 2017-05-06 LAB — E.BARR VIRUS IGG/IGM PANEL, BLOOD
Epstein Barr Virus (EBNA) Ab (IgG): 281 U/mL — ABNORMAL HIGH
Epstein Barr Virus VCA Ab (IgG): 68.7 U/mL — ABNORMAL HIGH
Epstein Barr Virus VCA Ab (IgM): <36 U/mL

## 2017-05-06 LAB — ANA MULTIPLEX W/REFLEX 11 AB CASCADE - QUEST: ANA Choice Screen: NEGATIVE

## 2017-05-06 LAB — CMV IGG/IGM AB PANEL, BLOOD
CMV Antibody (IgG): <0.6 U/mL
CMV Antibody (IgM): <30 AU/mL

## 2017-05-06 LAB — VITAMIN D, 25-OH TOTAL: Vitamin D, 25-OH, Total: 38 ng/mL (ref 30–100)

## 2017-05-06 LAB — HSV 1/2 AB (IGM), IFA W/RFL TO TITER -QUEST
HSV 1 IgM Screen: NEGATIVE
HSV 2 IgM Screen: NEGATIVE

## 2017-05-17 ENCOUNTER — Encounter: Payer: Self-pay | Admitting: Family Medicine

## 2017-05-23 LAB — HLA DQB1 LOW RESOLUTION - QUEST HISTORICAL

## 2017-05-23 LAB — HLA DRB3,4,5 TYPING, HIGH RESOLUTION - QUEST

## 2017-05-23 LAB — OSMOLALITY, BLOOD: Osmolality (Serum): 283 mOsm/kg (ref 278–305)

## 2017-05-23 LAB — C4A LEVEL - QUEST: C4A Level: 3882 ng/mL — ABNORMAL HIGH (ref 0–2830)

## 2017-05-23 LAB — ALPHA MELANOCYTE STIMULATING HORMONE - QUEST: Alpha Melanocyte Stimulating Hormone - Quest: 11.4 pg/mL (ref 0–100.0)

## 2017-05-23 LAB — MATRIX METALLOPROTEINASE 9 (MMP 9) - QUEST: Matrix Metalloproteinase 9 (MMP 9) Quest: 695 ng/mL (ref 0–900)

## 2017-05-23 LAB — HUMAN TRANSFORMING GROWTH FACTOR BETA 1 - QUEST: Human Transforming Growth Factor Beta 1: 2840 pg/mL — ABNORMAL HIGH (ref 344–2382)

## 2017-05-23 LAB — ANTI-DIURETIC HORMONE, BLOOD: Arginine Vasopressin: 1.4 pg/mL

## 2017-05-23 LAB — VASOACTIVE INTESTINAL POLYPEPTIDE (VIP), P - QUEST HISTORICAL: Vasoactive Intestinal Polypeptide (Vip), P: 54 pg/mL (ref <75)

## 2017-05-23 LAB — ADRENOCORTICOTROPIC HORM., BLOOD: ACTH,Plasma: 18 pg/mL (ref 6–50)

## 2017-06-02 NOTE — Telephone Encounter (Signed)
From: French AnaAlyssa Mcmillen  To: Elizebeth Kollerastillo, Nakeda Lebron  Sent: 06/01/2017 3:46 PM PST  Subject: ZO:XWRURE:test results    [place] going [place] and [place] not be back to [place] until the end of June. My mom Hughie ClossLaura Borromeo actually has an appointment with you to go over her test results on Tuesday, January 29 at 9am. I am wondering if she can book time with you after her appointment (or at another day and time) and  you can go over my results with her, as I am comfortable having you discuss this with her. I am open to any other ideas on how to go over my test results and what this means so I can start treatment.     Thank you,  Anika      ----- Message -----  From: Elizebeth KollerKimberly Cindia Hustead  Sent: 06/01/2017 10:30 AM PST  To: French AnaAlyssa Vonderhaar  Subject: test results  Hello Halah,    Dr.dominguez stated  that your [facility] results are consistent with CIRS and she will need an appointment with me or with Boykin Reaperachelle NP to discuss results in further detail as they require time and allow for questions to be addressed.     Thank you

## 2017-06-02 NOTE — Telephone Encounter (Signed)
Sent pt mychart message

## 2017-06-07 ENCOUNTER — Encounter: Payer: Self-pay | Admitting: Family Medicine

## 2017-06-08 ENCOUNTER — Telehealth: Payer: Self-pay

## 2017-06-08 NOTE — Telephone Encounter (Signed)
CALLED IN CHOLESTYRAMINE POWD 1SCOOP bid 1 MONTH WITH REFILL X4 . CALLED INTO [facility]

## 2017-06-15 NOTE — Telephone Encounter (Signed)
Sent pt my chart message letting pt know that I called in her beg spray. Done

## 2017-06-29 ENCOUNTER — Telehealth: Payer: Self-pay | Admitting: Family Medicine

## 2017-06-29 NOTE — Telephone Encounter (Signed)
Mom reports pt completed a Z-pack in the last two weeks due to infection. However, symptom of difficulty breathing associated with asthma is flaring. She has an appointment scheduled with the Pulmonologist tomorrow.  Medications she is taking include:  1. Argentyn silver 1 tsp in mouth, swishing, gargling, then swallowing fluid, 4-5 times/day   2. Monolaurin 2 caps daily   3. Viracid 2 caps daily   4. cholestyramine powder   5. BEG spray    Discussed case with attending. Pt was recommended to stop all medications listed above and introduce back one at a time. She was recommended to monitor each item for three days before introducing another. In the case symptoms were worse after introducing one she was recommended to notify the clinic and stop use of the medication. She was recommended to continue with medications that improve symptoms or have no observable change in symptoms.

## 2017-06-29 NOTE — Telephone Encounter (Signed)
PATIENTS MOM CALLED ASKING TO SPEAK TO DR. Roselle LocusMINGUEZ OR RACHELLE REGARDING HER DAUGHTER'S ASTHMA IS FLARING UP. SHE HAS BEEN HAVING TROUBLE BREATHING FOR THE PAST 2 WEEKS BUT TODAY IT IS WORSE. SHE WANTS TO KNOW IF IT IS THE SUPPLEMENTS RX'D FROM DR. Roselle LocusMINGUEZ LAST OFFICE VISIT OR IF IT IS SOMETHING ELSE. 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 SHE JUST GOT OVER BRONCHITIS AND WAS RX'D Z-PACK TO TAKE FOR 10 DAYS AND SHE FINISHED THAT.MOM SAYS SHE DOES NOT KNOW WHEN PT FINISHED THE Z-PACK. SHE STATED Aften HAS COME DOWN WITH A COLD ON Sunday AS WELL. THE SUPPLEMENTS SHE IS CURRENTLY TAKING ARE:  1. Argentyn silver 1 tsp in mouth, swishing, gargling, then swallowing fluid, 4-5 times/day   2. Monolaurin 2 caps daily   3. Viracid 2 caps daily   4. cholestyramine powder   5. BEG spray    I ROUTED THIS PHONE CALL TO THE RESIDENT ON CALL (DR. SONI) [place]

## 2017-06-30 ENCOUNTER — Telehealth: Payer: Self-pay | Admitting: Naturopath

## 2017-06-30 NOTE — Telephone Encounter (Addendum)
Discussed case with attending and called patient to follow up on symptom of SOB reported by patient's mother yesterday. Patient confirmed we have permission to discuss her case with her mother. Patient met with pulmonologist today. The pulmonologist determined that it was the new inhalor prescription that was causing her symptoms. She received a steroid injection at the pulmonologist and was switched to the inhalor prescription that she used in the past that worked well for her. Pt reports all her symptoms are improving and she is no longer experiencing SOB. She was advised not to introduce any supplements until all symptoms have completely resolved. She was reminded to go to the ER if symptoms of SOB occur again or in the future.

## 2017-07-19 ENCOUNTER — Telehealth: Payer: Self-pay | Admitting: Family Medicine

## 2017-07-19 NOTE — Telephone Encounter (Signed)
DR. Roselle LocusMINGUEZ PT    LAURA (MOM) CALLED IN STATING THAT HER INSURANCE IS DENYING COVERAGE FOR BLOOD TEST COMPLETED ON 05/05/17. LAURA STATED THAT PER HER INSURANCE THEY SUBMITTED AN ELECTRONIC REQUEST TO DR REQUESTING ADDITIONAL CLINICAL INFORMATION FOR NEED FOR TEST IN January AND February. LAURA STATED HER INSURANCE DID NOT PROVIDE HER WITH WHAT INFORMATION THEY NEEDED NOR A NUMBER TO FOLLOW UP.

## 2017-07-20 NOTE — Telephone Encounter (Signed)
Sent over the new codes and possible missed ones to [facility]. Done

## 2017-08-07 ENCOUNTER — Encounter: Payer: Self-pay | Admitting: Family Medicine

## 2017-08-18 NOTE — Telephone Encounter (Signed)
Called pt and lvm to call us back for a appt for her microbiology swab test.

## 2017-11-08 ENCOUNTER — Ambulatory Visit: Payer: BC Managed Care – PPO | Admitting: Family Medicine

## 2017-11-08 ENCOUNTER — Encounter: Payer: Self-pay | Admitting: Family Medicine

## 2017-11-08 VITALS — BP 111/64 | HR 80 | Temp 99.8°F | Wt 152.1 lb

## 2017-11-08 DIAGNOSIS — J42 Unspecified chronic bronchitis: Principal | ICD-10-CM

## 2017-11-08 NOTE — Progress Notes (Signed)
Subjective:   Kathleen Gibbs is a 22 year old female who is here for Follow Up      HPI    Issues discussed today:  Pt here for f/u visit. Pt feels well now. Pt did have bronchitis early this year. Graduated from college in May. Restarted CSM powder 1 scoop daily for the past 1 month, tolerating this well, no GI side effects.   Was doing BEG nasal spray while at school did use it 1-2 times/day for about 3 weeks, stopped beginning of May. Did another 3 week round of BEG spray Feb/March.   Mom with pt here, said that her mold was coming from outside patio covering and tile also had mold growing and mold spores were getting into the house when windows were open. All removed , mom need to repeat house test. Mom has [employer] ERMI cloth testing kit. Pt has been home for 1 week, not living at home. Pt living in an old house, lots of people there. Plans to be there the month of July.   Pt does feels dizzy when stands up quickly.     Review of Systems   Constitutional: Negative.    HENT: Negative.    Eyes: Negative.    Respiratory: Negative.    Cardiovascular: Negative.    Gastrointestinal: Negative.    Endocrine: Negative.    Musculoskeletal: Negative.    Skin: Negative.    Neurological: Positive for dizziness.        When stands quickly   Hematological: Negative.    Psychiatric/Behavioral: Negative.         Current Outpatient Medications   Medication Sig Dispense Refill   . albuterol 108 (90 Base) MCG/ACT inhaler Inhale 2 puffs by mouth.     . Azelastine-Fluticasone (DYMISTA) 137-50 MCG/ACT SUSP      . beclomethasone (QVAR REDIHALER) 80 MCG/ACT inhaler Inhale 2 puffs by mouth.     Marland Kitchen LEENA 0.5/1/0.5-35 MG-MCG TABS        No current facility-administered medications for this visit.      No Known Allergies    Reviewed patients pertinent information related to social history, past medical, past surgical, and family history.     Objective:  Vital signs: BP 111/64 (BP Location: Right arm, BP Patient Position: Sitting, BP cuff  size: Regular)   Pulse 80   Temp 99.8 F (37.7 C) (Tympanic)   Wt 69 kg (152 lb 1.9 oz)   SpO2 97%   BMI 23.13 kg/m     Physical Exam   Constitutional: She appears well-developed and well-nourished.   HENT:   Head: Normocephalic and atraumatic.   Psychiatric: She has a normal mood and affect. Her behavior is normal. Judgment and thought content normal.   Nursing note and vitals reviewed.      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 Pt feeling well right now. To finish 1 more week of CSM powder and take 1 scoop twice/day, then to have both [facility] and [facility] biotoxin levels once done. Also repeated Microbiology DX nasal culture swab for Marcons and biofilm. Pt to follow up with me once all lab results are back, if she is unable to come in for a visit, pt to My Chart me so I can then respond back. Spent 40 min w/pt and her mom,with more than 50% time in counseling, answered all questions and concerns to best of my  ability.   Kathleen Gibbs was seen today for follow up.    Diagnoses and all orders for this visit:    Chronic bronchitis, unspecified chronic bronchitis type (CMS-HCC)    Other chronic sinusitis    Disorder of immune system (CMS-HCC)

## 2017-11-08 NOTE — Patient Instructions (Signed)
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 Pt feeling well right now. To finish 1 more week of CSM powder and take 1 scoop twice/day, then to have both [facility] and [facility] biotoxin levels once done. Also repeated Microbiology DX nasal culture swab for Marcons and biofilm. Pt to follow up with me once all lab results are back, if she is unable to come in for a visit, pt to My Chart me so I can then respond back. Spent 40 min w/pt and her mom,with more than 50% time in counseling, answered all questions and concerns to best of my ability.

## 2017-12-20 ENCOUNTER — Encounter: Payer: Self-pay | Admitting: Family Medicine

## 2018-01-17 ENCOUNTER — Ambulatory Visit: Payer: BC Managed Care – PPO | Admitting: Family Medicine

## 2018-03-28 ENCOUNTER — Ambulatory Visit: Payer: BC Managed Care – PPO | Admitting: Family Medicine

## 2018-05-16 ENCOUNTER — Ambulatory Visit: Payer: BC Managed Care – PPO | Admitting: Family Medicine

## 2018-06-27 ENCOUNTER — Ambulatory Visit: Payer: BC Managed Care – PPO | Admitting: Family Medicine

## 2018-08-15 ENCOUNTER — Ambulatory Visit: Payer: BC Managed Care – PPO | Admitting: Family Medicine

## 2018-08-29 ENCOUNTER — Encounter: Payer: BC Managed Care – PPO | Admitting: Family Medicine

## 2018-08-29 ENCOUNTER — Ambulatory Visit: Payer: BC Managed Care – PPO | Admitting: Family Medicine

## 2018-12-05 ENCOUNTER — Encounter: Payer: Self-pay | Admitting: Family Medicine

## 2018-12-05 ENCOUNTER — Ambulatory Visit (INDEPENDENT_AMBULATORY_CARE_PROVIDER_SITE_OTHER): Payer: BC Managed Care – PPO | Admitting: Family Medicine

## 2018-12-05 VITALS — BP 114/71 | HR 67 | Temp 97.9°F | Resp 16 | Ht 69.0 in | Wt 153.0 lb

## 2018-12-05 DIAGNOSIS — J328 Other chronic sinusitis: Secondary | ICD-10-CM

## 2018-12-05 DIAGNOSIS — D899 Disorder involving the immune mechanism, unspecified: Secondary | ICD-10-CM

## 2018-12-05 DIAGNOSIS — J42 Unspecified chronic bronchitis: Secondary | ICD-10-CM

## 2018-12-05 DIAGNOSIS — Z91018 Allergy to other foods: Secondary | ICD-10-CM

## 2018-12-05 MED ORDER — CLOBETASOL 17 PROPIONATE 0.5 % POWD: Status: AC

## 2018-12-05 MED ORDER — HYDROCORTISONE 2.5 % EX CREA
TOPICAL_CREAM | CUTANEOUS | Status: AC
Start: 2018-09-27 — End: ?

## 2018-12-05 MED ORDER — BECLOMETHASONE DIPROP HFA 40 MCG/ACT IN AERB: INHALATION_SPRAY | RESPIRATORY_TRACT | Status: AC

## 2018-12-05 MED ORDER — CICLOPIROX 1 % EX SHAM: MEDICATED_SHAMPOO | CUTANEOUS | Status: AC

## 2018-12-05 MED ORDER — COLESEVELAM HCL 3.75 G PO PACK
1.00 | PACK | Freq: Once | ORAL | 6 refills | Status: AC | PRN
Start: 2018-12-05 — End: ?

## 2018-12-05 MED ORDER — CLOBETASOL PROPIONATE 0.05 % EX FOAM
CUTANEOUS | Status: AC
Start: 2018-12-04 — End: ?

## 2018-12-05 MED ORDER — DAYSEE 0.15-0.03 &0.01 MG PO TABS
1.00 | ORAL_TABLET | Freq: Every day | ORAL | Status: AC
Start: 2018-11-05 — End: ?

## 2018-12-05 NOTE — Progress Notes (Signed)
Subjective:   Kathleen Gibbs is a 23 year old female who is here for Follow Up      HPI    Issues discussed today:  Pt here for f/u visit. Pt feel well. Hasn't gotten sick since she took CSM powder and not going to school. Pt can tell when she is in her apt in [place] she has more trouble breathing through her nose, feel better at home with air filtration system in place. Does have mild bilat maxillary sinus pressure. Pt has not done ERMI cloth test of her [place] apt.   Pt didn't get chance to take nasal spray for large amounts of MARCoNS since last tested July 2019.         Review of Systems     Current Outpatient Medications   Medication Sig Dispense Refill   . albuterol 108 (90 Base) MCG/ACT inhaler Inhale 2 puffs by mouth.     . Azelastine-Fluticasone (DYMISTA) 137-50 MCG/ACT SUSP      . beclomethasone (QVAR REDIHALER) 40 MCG/ACT inhaler Qvar RediHaler 80 mcg/actuation HFA breath activated aerosol     . beclomethasone (QVAR REDIHALER) 80 MCG/ACT inhaler Inhale 2 puffs by mouth.     . Ciclopirox 1 % SHAM ciclopirox 1 % shampoo     . Clobetasol Propionate (CLOBETASOL 17 PROPIONATE) 0.5 % POWD clobetasol 0.05 % topical foam     . clobetasol propionate (OLUX) 0.05 % foam      . Colesevelam HCl 3.75 g PACK Take 1 Package by mouth once as needed (2 hours away from food) for up to 30 doses. 5 each 6   . DAYSEE 0.15-0.03 &0.01 MG tablet Take 1 tablet by mouth daily.     . hydrocortisone 2.5 % cream APPLY TO CORNER OF MOUTH TWO TIMES DAILY X2 WEEKS MAX     . LEENA 0.5/1/0.5-35 MG-MCG TABS        No current facility-administered medications for this visit.      No Known Allergies    Reviewed patients pertinent information related to social history, past medical, past surgical, and family history.     Objective:  Vital signs: BP 114/71 (BP Location: Right arm, BP Patient Position: Sitting, BP cuff size: Regular)   Pulse 67   Temp 97.9 F (36.6 C) (Temporal)   Resp 16   Ht 5\' 9"  (1.753 m)   Wt 69.4 kg (153 lb)   LMP  11/03/2018 (Approximate)   SpO2 100%   BMI 22.59 kg/m     Physical Exam  Constitutional:       Appearance: Normal appearance.   HENT:      Head: Normocephalic and atraumatic.   Neurological:      General: No focal deficit present.      Mental Status: She is alert and oriented to person, place, and time.   Psychiatric:         Mood and Affect: Mood normal.         Behavior: Behavior normal.         Thought Content: Thought content normal.         Judgment: Judgment normal.         Assessment/Plan:  Kathleen Gibbs was seen today for follow up.    Diagnoses and all orders for this visit:    Chronic bronchitis, unspecified chronic bronchitis type (CMS-HCC)    Other chronic sinusitis    Allergy to food    Disorder of immune system (CMS-HCC)    Other orders  -  Colesevelam HCl 3.75 g PACK; Take 1 Package by mouth once as needed (2 hours away from food) for up to 30 doses.    Pt with CIRS, completed course of CSM powder, has large amount MARCONS and not adequately treated yet. Pt doing well at this time, however does have sinus and nasal sx when in her [place] apt and in boyfriend's bathroom. Pt to continue completing CIRS tx via Dr Wilburn Cornelia protcol. Spent 25 min w/pt , answered all q/c.     Recommendations:  1. Call [facility] pharmacy with following prescription: EDTA /silver nasal spray per Dr Wilburn Cornelia protcol, 2 sprays per nostril 3 times/day, 1 bottle with 1 refill.  2. I ordered pt generic Welchol tablets and to take packet if in non safe building, to help reduce/prevent CIRS symptoms.  3. Recommended pt buy [employer] or [employer] air purification system for her [place] apt to use 24/7 as directed.  4. Repeat VCS test at [email or web address] after finishing 1 bottle of EDTA/silver nasal spray. If she passes , pt to come to office for repeat Microbiology DX nasal MARCONS testing and [facility] biotoxin panel. If she fails, pt to complete second EDTA/silver nasal spray bottle and repeat online VCS testing.   5. Pt to make follow up  appointment to review labs once available.

## 2018-12-05 NOTE — Patient Instructions (Signed)
Recommendations:  1. Call [facility] pharmacy with following prescription: EDTA /silver nasal spray per Dr Wilburn Cornelia protcol, 2 sprays per nostril 3 times/day, 1 bottle with 1 refill.  2. I ordered pt generic Welchol tablets and to take packet if in non safe building, to help reduce/prevent CIRS symptoms.  3. Recommended pt buy [employer] or [employer] air purification system for her [place] apt to use 24/7 as directed.  4. Repeat VCS test at [email or web address] after finishing 1 bottle of EDTA/silver nasal spray. If she passes , pt to come to office for repeat Microbiology DX nasal MARCONS testing and [facility] biotoxin panel. If she fails, pt to complete second EDTA/silver nasal spray bottle and repeat online VCS testing.   5. Pt to make follow up appointment to review labs once available.
